# Patient Record
Sex: Female | Born: 1980
Health system: Southern US, Community
[De-identification: ages and names within clinical notes are randomized; demographics above are authoritative.]

## PROBLEM LIST (undated history)

## (undated) DIAGNOSIS — I10 Essential (primary) hypertension: Secondary | ICD-10-CM

## (undated) DIAGNOSIS — IMO0002 Reserved for concepts with insufficient information to code with codable children: Secondary | ICD-10-CM

## (undated) DIAGNOSIS — F418 Other specified anxiety disorders: Secondary | ICD-10-CM

## (undated) DIAGNOSIS — D219 Benign neoplasm of connective and other soft tissue, unspecified: Secondary | ICD-10-CM

## (undated) DIAGNOSIS — D649 Anemia, unspecified: Secondary | ICD-10-CM

## (undated) DIAGNOSIS — D259 Leiomyoma of uterus, unspecified: Secondary | ICD-10-CM

## (undated) DIAGNOSIS — F419 Anxiety disorder, unspecified: Secondary | ICD-10-CM

## (undated) DIAGNOSIS — K219 Gastro-esophageal reflux disease without esophagitis: Secondary | ICD-10-CM

## (undated) DIAGNOSIS — N920 Excessive and frequent menstruation with regular cycle: Secondary | ICD-10-CM

## (undated) DIAGNOSIS — Z973 Presence of spectacles and contact lenses: Secondary | ICD-10-CM

## (undated) DIAGNOSIS — Z8679 Personal history of other diseases of the circulatory system: Secondary | ICD-10-CM

## (undated) HISTORY — DX: Reserved for concepts with insufficient information to code with codable children: IMO0002

## (undated) HISTORY — PX: ANKLE SURGERY: SHX546

---
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
## 2002-06-13 ENCOUNTER — Other Ambulatory Visit: Admission: RE | Admit: 2002-06-13 | Discharge: 2002-06-13 | Payer: Self-pay | Admitting: Family Medicine

## 2012-04-03 DIAGNOSIS — R87613 High grade squamous intraepithelial lesion on cytologic smear of cervix (HGSIL): Secondary | ICD-10-CM

## 2012-04-03 DIAGNOSIS — Z8742 Personal history of other diseases of the female genital tract: Secondary | ICD-10-CM

## 2012-04-03 HISTORY — DX: High grade squamous intraepithelial lesion on cytologic smear of cervix (HGSIL): R87.613

## 2012-04-03 HISTORY — DX: Personal history of other diseases of the female genital tract: Z87.42

## 2012-04-03 HISTORY — PX: CERVICAL BIOPSY  W/ LOOP ELECTRODE EXCISION: SUR135

## 2012-08-21 ENCOUNTER — Other Ambulatory Visit: Payer: Self-pay | Admitting: Family Medicine

## 2012-08-21 ENCOUNTER — Ambulatory Visit (INDEPENDENT_AMBULATORY_CARE_PROVIDER_SITE_OTHER): Payer: Self-pay | Admitting: Family Medicine

## 2012-08-21 ENCOUNTER — Encounter: Payer: Self-pay | Admitting: Family Medicine

## 2012-08-21 ENCOUNTER — Other Ambulatory Visit (HOSPITAL_COMMUNITY)
Admission: RE | Admit: 2012-08-21 | Discharge: 2012-08-21 | Disposition: A | Discharge status: Home / Self Care | Payer: Self-pay | Source: Ambulatory Visit | Attending: Family Medicine | Admitting: Family Medicine

## 2012-08-21 VITALS — BP 141/89 | HR 88 | Temp 98.1°F | Resp 20 | Ht 65.0 in | Wt 125.4 lb

## 2012-08-21 DIAGNOSIS — N871 Moderate cervical dysplasia: Secondary | ICD-10-CM | POA: Insufficient documentation

## 2012-08-21 DIAGNOSIS — R87613 High grade squamous intraepithelial lesion on cytologic smear of cervix (HGSIL): Secondary | ICD-10-CM

## 2012-08-21 DIAGNOSIS — Z01812 Encounter for preprocedural laboratory examination: Secondary | ICD-10-CM

## 2012-08-21 NOTE — Progress Notes (Signed)
UPT:negative

## 2012-08-21 NOTE — Patient Instructions (Addendum)
Colposcopy  Care After  Colposcopy is a procedure in which a special tool is used to magnify the surface of the cervix. A tissue sample (biopsy) may also be taken. This sample will be looked at for cervical cancer or other problems. After the test:  · You may have some cramping.  · Lie down for a few minutes if you feel lightheaded.  ·  You may have some bleeding which should stop in a few days.  HOME CARE  · Do not have sex or use tampons for 2 to 3 days or as told.  · Only take medicine as told by your doctor.  · Continue to take your birth control pills as usual.  Finding out the results of your test  Ask when your test results will be ready. Make sure you get your test results.  GET HELP RIGHT AWAY IF:  · You are bleeding a lot or are passing blood clots.  · You develop a fever of 102° F (38.9° C) or higher.  · You have abnormal vaginal discharge.  · You have cramps that do not go away with medicine.  · You feel lightheaded, dizzy, or pass out (faint).  MAKE SURE YOU:   · Understand these instructions.  · Will watch your condition.  · Will get help right away if you are not doing well or get worse.  Document Released: 09/06/2007 Document Revised: 06/12/2011 Document Reviewed: 09/06/2007  ExitCare® Patient Information ©2014 [employer].

## 2012-08-21 NOTE — Progress Notes (Signed)
Patient ID: Katherine Ingram, female   DOB: 03/11/1981, 32 y.o.   MRN: [id number]   [name] is here in follow up to an abnormal pap smear at [facility] on 06/25/12. Report shows HSIL - encompassing CIS, moderate and severe dysplasia. She states that she had not had any previous abnormal pap smears and has never had a colposcopy.  She has fibroids and sometimes has 2 periods in one month, somewhat heavy. She does not smoke.   Filed Vitals:   08/21/12 1552  BP: 141/89  Pulse: 88  Temp: 98.1 F (36.7 C)  Resp: 20   Exam: Gen: alert, mildly anxious, no distress Abd:  Soft, non-tender, no guarding or rebound GU:  Normal external genitalia, uterus normal size and mobility, anteflexed, no CMT, no vaginal discharge, normal non-parous cervix, no bleeding.  GYNECOLOGY CLINIC PROCEDURE NOTE  32 y.o. G0P0 here for colposcopy for high-grade squamous intraepithelial neoplasia  (HGSIL-encompassing moderate and severe dysplasia) pap smear on 06/25/12. Discussed role for HPV in cervical dysplasia, need for surveillance.  Patient given informed consent, signed copy in the chart, time out was performed.  Placed in lithotomy position. Cervix viewed with speculum and colposcope after application of acetic acid.   Colposcopy adequate? No - transformation zone within external os, not entirely visible.  acetowhite lesion(s) noted at 6 o'clock-8 o'clock, punctation noted at 6 o'clock and mosaicism noted at 5 o'clock; biopsies obtained.  ECC specimen obtained. All specimens were labelled and sent to pathology.  Patient was given post procedure instructions.  Will follow up pathology and manage accordingly.  Routine preventative health maintenance measures emphasized.  Napoleon Form, MD

## 2012-08-23 ENCOUNTER — Encounter: Payer: Self-pay | Admitting: Family Medicine

## 2012-08-23 DIAGNOSIS — R87613 High grade squamous intraepithelial lesion on cytologic smear of cervix (HGSIL): Secondary | ICD-10-CM | POA: Insufficient documentation

## 2012-09-23 ENCOUNTER — Telehealth: Payer: Self-pay | Admitting: *Deleted

## 2012-09-23 NOTE — Telephone Encounter (Signed)
Called pt and left message to return to call to the clinics.  

## 2012-09-23 NOTE — Telephone Encounter (Signed)
Patient left message that she was seen a month ago and hasn't heard her results yet.

## 2012-09-24 NOTE — Telephone Encounter (Signed)
Spoke to pt about her results and the need for a LEEP procedure.  I explained what the LEEP procedure is and that due to our office closing.  If she could please call and schedule the appt.  Pt stated understanding and did not have any other questions.

## 2012-10-28 ENCOUNTER — Encounter: Payer: Self-pay | Admitting: Obstetrics and Gynecology

## 2012-10-28 ENCOUNTER — Other Ambulatory Visit (HOSPITAL_COMMUNITY)
Admission: RE | Admit: 2012-10-28 | Discharge: 2012-10-28 | Disposition: A | Discharge status: Home / Self Care | Payer: Self-pay | Source: Ambulatory Visit | Attending: Obstetrics and Gynecology | Admitting: Obstetrics and Gynecology

## 2012-10-28 ENCOUNTER — Ambulatory Visit (INDEPENDENT_AMBULATORY_CARE_PROVIDER_SITE_OTHER): Payer: Self-pay | Admitting: Obstetrics and Gynecology

## 2012-10-28 VITALS — BP 132/84 | HR 85 | Temp 97.8°F | Ht 71.0 in | Wt 125.5 lb

## 2012-10-28 DIAGNOSIS — G8918 Other acute postprocedural pain: Secondary | ICD-10-CM

## 2012-10-28 DIAGNOSIS — N871 Moderate cervical dysplasia: Secondary | ICD-10-CM | POA: Insufficient documentation

## 2012-10-28 DIAGNOSIS — R87613 High grade squamous intraepithelial lesion on cytologic smear of cervix (HGSIL): Secondary | ICD-10-CM

## 2012-10-28 MED ORDER — IBUPROFEN 200 MG PO TABS
600.0000 mg | ORAL_TABLET | Freq: Once | ORAL | Status: AC
  Administered 2012-10-28: 600 mg via ORAL

## 2012-10-28 NOTE — Progress Notes (Signed)
Patient ID: Guliana Weyandt, female   DOB: Dec 24, 1980, 32 y.o.   MRN: [id number] Patient identified, informed consent obtained, signed copy in chart, time out performed.  Pap smear and colposcopy reviewed.   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  Cervix visualized. Paracervical block placed.  Medium size LOOP used to remove cone of cervix using blend of cut and cautery on LEEP machine.  Edges/Base cauterized with Ball.  Monsel's solution used for hemostasis.  Patient tolerated procedure well.  Patient given post procedure instructions.  Follow up in 6 months for repeat pap or as needed.

## 2012-10-28 NOTE — Addendum Note (Signed)
Addended by: Gerome Apley on: 10/28/2012 03:08 PM   Modules accepted: Orders

## 2012-11-04 ENCOUNTER — Telehealth: Payer: Self-pay | Admitting: Obstetrics and Gynecology

## 2012-11-04 NOTE — Telephone Encounter (Addendum)
Message copied by Toula Moos on Mon Nov 04, 2012  3:52 PM ------called patient and notified of results noted below. Patient agrees and knows to make an appointment in 6 months for repeat pap. Patient satisfied.       Message from: CONSTANT, PEGGY      Created: Mon Nov 04, 2012  2:34 PM       Please inform patient that all abnormal cells were contained within the specimen removed. Follow up as planned in 6 months for repeat pap smear. ------

## 2013-01-22 ENCOUNTER — Encounter: Payer: Self-pay | Admitting: *Deleted

## 2013-02-06 ENCOUNTER — Other Ambulatory Visit: Payer: Self-pay

## 2013-04-11 ENCOUNTER — Ambulatory Visit: Payer: Self-pay | Admitting: Obstetrics & Gynecology

## 2013-04-21 ENCOUNTER — Encounter: Payer: Self-pay | Admitting: Obstetrics & Gynecology

## 2013-04-21 ENCOUNTER — Ambulatory Visit (INDEPENDENT_AMBULATORY_CARE_PROVIDER_SITE_OTHER): Payer: PRIVATE HEALTH INSURANCE | Admitting: Obstetrics & Gynecology

## 2013-04-21 VITALS — BP 121/79 | HR 92 | Temp 97.8°F | Ht 65.0 in | Wt 134.1 lb

## 2013-04-21 DIAGNOSIS — Z1151 Encounter for screening for human papillomavirus (HPV): Secondary | ICD-10-CM

## 2013-04-21 DIAGNOSIS — R87613 High grade squamous intraepithelial lesion on cytologic smear of cervix (HGSIL): Secondary | ICD-10-CM

## 2013-04-21 NOTE — Progress Notes (Signed)
Patient ID: Katherine Ingram, female   DOB: [birth year], 33 y.o.   MRN: [id number] 33 yo G0 s/p LEEP in July 2014 secondary to HGSIL on pap who is here for repeat pap smear. Patient is otherwise doing well  GENERAL: Well-developed, well-nourished female in no acute distress.  PELVIC: Normal external female genitalia. Vagina is pink and rugated.  Normal discharge. Normal appearing cervix. Uterus is normal in size. No adnexal mass or tenderness. EXTREMITIES: No cyanosis, clubbing, or edema, 2+ distal pulses.  A/P 33 yo here for 25-month follow up pap - Pap smear collected - Patient will be contacted with any abnormal results - Plan for repeat pap smear in 6 months pending results

## 2016-04-03 DIAGNOSIS — Z9289 Personal history of other medical treatment: Secondary | ICD-10-CM

## 2016-04-03 HISTORY — DX: Personal history of other medical treatment: Z92.89

## 2016-04-06 ENCOUNTER — Ambulatory Visit (INDEPENDENT_AMBULATORY_CARE_PROVIDER_SITE_OTHER): Payer: BLUE CROSS/BLUE SHIELD | Admitting: Obstetrics and Gynecology

## 2016-04-06 ENCOUNTER — Encounter: Payer: Self-pay | Admitting: Obstetrics and Gynecology

## 2016-04-06 VITALS — BP 135/82 | HR 82 | Wt 132.4 lb

## 2016-04-06 DIAGNOSIS — Z124 Encounter for screening for malignant neoplasm of cervix: Secondary | ICD-10-CM | POA: Diagnosis not present

## 2016-04-06 DIAGNOSIS — Z0000 Encounter for general adult medical examination without abnormal findings: Secondary | ICD-10-CM | POA: Diagnosis not present

## 2016-04-06 DIAGNOSIS — Z1151 Encounter for screening for human papillomavirus (HPV): Secondary | ICD-10-CM | POA: Diagnosis not present

## 2016-04-06 DIAGNOSIS — Z01419 Encounter for gynecological examination (general) (routine) without abnormal findings: Secondary | ICD-10-CM | POA: Diagnosis not present

## 2016-04-06 NOTE — Progress Notes (Signed)
Subjective:     Katherine Ingram is a 36 y.o. female G0 who is here for a comprehensive physical exam. The patient reports no problems. She is sexually active without contraception. She is not interested in contraception at this time. She reports regular monthly period of 4 days associated with some dysmenorrhea which is well controlled with ibuprofen. Patient denies any abnormal discharge  Past Medical History:  Diagnosis Date  . Abnormal Pap smear    Past Surgical History:  Procedure Laterality Date  . ANKLE SURGERY     Lt ankle fracture   Family History  Problem Relation Age of Onset  . Diabetes Father   . Scoliosis Sister     Social History   Social History  . Marital status: Unknown    Spouse name: N/A  . Number of children: N/A  . Years of education: N/A   Occupational History  . Not on file.   Social History Main Topics  . Smoking status: Former Smoker    Types: Cigarettes  . Smokeless tobacco: Not on file  . Alcohol use No  . Drug use: No  . Sexual activity: No   Other Topics Concern  . Not on file   Social History Narrative  . No narrative on file   Health Maintenance  Topic Date Due  . HIV Screening  03/10/1996  . TETANUS/TDAP  03/10/2000  . INFLUENZA VACCINE  11/02/2015  . PAP SMEAR  04/21/2016       Review of Systems Pertinent items are noted in HPI.   Objective:  Blood pressure 135/82, pulse 82, weight 132 lb 6.4 oz (60.1 kg), last menstrual period 04/01/2016.      GENERAL: Well-developed, well-nourished female in no acute distress.  HEENT: Normocephalic, atraumatic. Sclerae anicteric.  NECK: Supple. Normal thyroid.  LUNGS: Clear to auscultation bilaterally.  HEART: Regular rate and rhythm. BREASTS: Symmetric in size. No palpable masses or lymphadenopathy, skin changes, or nipple drainage. ABDOMEN: Soft, nontender, nondistended. Palpable fibroid uterus PELVIC: Normal external female genitalia. Vagina is pink and rugated.  Normal discharge.  Normal appearing cervix. Uterus is 12-weeks size. No adnexal mass or tenderness. EXTREMITIES: No cyanosis, clubbing, or edema, 2+ distal pulses.    Assessment:    Healthy female exam.      Plan:    pap smear collected Patient with palpable fibroid but currently asymptomatic. Precautions reviewed Patient will be notified af abnormal results Advised patient to continue monthly breast exam RTC prn See After Visit Summary for Counseling Recommendations

## 2016-04-12 LAB — CYTOLOGY - PAP
Diagnosis: NEGATIVE
HPV: DETECTED — AB

## 2016-08-04 ENCOUNTER — Ambulatory Visit: Payer: BLUE CROSS/BLUE SHIELD | Admitting: Obstetrics and Gynecology

## 2016-09-27 ENCOUNTER — Encounter: Payer: Self-pay | Admitting: Obstetrics and Gynecology

## 2016-09-27 ENCOUNTER — Ambulatory Visit (INDEPENDENT_AMBULATORY_CARE_PROVIDER_SITE_OTHER): Payer: BLUE CROSS/BLUE SHIELD | Admitting: Obstetrics and Gynecology

## 2016-09-27 VITALS — BP 127/71 | HR 92 | Ht 65.0 in | Wt 132.8 lb

## 2016-09-27 DIAGNOSIS — D259 Leiomyoma of uterus, unspecified: Secondary | ICD-10-CM | POA: Diagnosis not present

## 2016-09-27 NOTE — Progress Notes (Signed)
36 yo G0 here for evaluation on fibroid uterus. Patient reports a monthly period lasting 3-4 days with moderate flow, unchanged from her last visit. She reports some LLQ discomfort around the time of her period. The pain is mild and worst with palpation of the area. She states that she can feel a mass when she pushes on the area. She plans to conceive in the next few years once she is married.   Past Medical History:  Diagnosis Date  . Abnormal Pap smear    Past Surgical History:  Procedure Laterality Date  . ANKLE SURGERY     Lt ankle fracture   Family History  Problem Relation Age of Onset  . Diabetes Father   . Scoliosis Sister    Social History  Substance Use Topics  . Smoking status: Former Smoker    Years: 15.00    Types: Cigarettes  . Smokeless tobacco: Never Used  . Alcohol use No   ROS See pertinent in HPI  Blood pressure 127/71, pulse 92, height 5\' 5"  (1.651 m), weight 132 lb 12.8 oz (60.2 kg), last menstrual period 09/17/2016. GENERAL: Well-developed, well-nourished female in no acute distress.  ABDOMEN: Soft, nontender, nondistended. Palpable fibroid uterus, bulky, mobile approximately 16-week size PELVIC: Patient declined EXTREMITIES: No cyanosis, clubbing, or edema, 2+ distal pulses.  A/P 36 yo with fibroid uterus - Pelvic ultrasound ordered - Discussed surgical intervention with myomectomy pending size of fibroids prior to conception. Patient understands the benefits but is fearful of surgery in general - Patient will be contacted with ultrasound results and will decide on surgical intervention when ready - RTC prn

## 2016-09-27 NOTE — Patient Instructions (Signed)
Myomectomy Myomectomy is surgery to remove a noncancerous tumor (myoma) from the uterus. Myomas are tumors made up of fibrous tissue. They are often called fibroid tumors. Fibroid tumors can range from the size of a pea to the size of a grapefruit. In a myomectomy, the fibroid tumor is removed without removing the uterus. Because these tumors are rarely cancerous, this surgery is usually done only if the tumor is growing or causing symptoms such as pain, pressure, bleeding, or pain with intercourse. LET YOUR HEALTH CARE PROVIDER KNOW ABOUT:  Any allergies you have.  All medicines you are taking, including vitamins, herbs, eye drops, creams, and over-the-counter medicines.  Previous problems you or members of your family have had with the use of anesthetics.  Any blood disorders you have.  Previous surgeries you have had.  Medical conditions you have. RISKS AND COMPLICATIONS Generally, this is a safe procedure. However, as with any procedure, complications can occur. Possible complications include:  Excessive bleeding.  Infection.  Injury to nearby organs.  Blood clots in the legs, chest, or brain.  Scar tissue on other organs and in the pelvis. This may require another surgery to remove the scar tissue.  BEFORE THE PROCEDURE  Ask your health care provider about changing or stopping your regular medicines. Avoid taking aspirin or blood thinners as directed by your health care provider.  Do not  eat or drink anything after midnight on the night before surgery.  If you smoke, do not  smoke for 2 weeks before the surgery.  Do not  drink alcohol the day before the surgery.  Arrange for someone to drive you home after the procedure or after your hospital stay. Also arrange for someone to help you with activities during your recovery. PROCEDURE You will be given medicine to make you sleep through the procedure (general anesthetic). Any of the following methods may be used to perform  a myomectomy:  Small monitors will be put on your body. They are used to check your heart, blood pressure, and oxygen level.  An IV access tube will be put into one of your veins. Medicine will be able to flow directly into your body through this IV tube.  You might be given a medicine to help you relax (sedative).  You will be given a medicine to make you sleep (general anesthetic). A breathing tube will be placed into your lungs during the procedure.  A thin, flexible tube (catheter) will be inserted into your bladder to collect urine.  Any of the following methods may be used to perform a myomectomy: ? Hysteroscopic myomectomy-This method may be used when the fibroid tumor is inside the cavity of the uterus. A long, thin tube that is like a telescope (hysteroscope) is inserted inside the uterus. A saline solution is put into your uterus. This expands the uterus and allows the surgeon to see the fibroids. Tools are passed through the hysteroscope to remove the fibroid tumor in pieces. ? Laparoscopic myomectomy-A few small cuts (incisions) are made in the lower abdomen. A thin, lighted tube with a tiny camera on the end (laparoscope) is inserted through one of the incisions. This gives the surgeon a good view of the area. The fibroid tumor is removed through the other incisions. The incisions are then closed with stitches (sutures) or staples. ? Abdominal myomectomy-This method is used when the fibroid tumor cannot be removed with a hysteroscope or laparoscope. The surgery is performed through a larger surgical incision in the abdomen. The   fibroid tumor is removed through this incision. The incision is closed with sutures or staples.  What to expect after the procedure  If you had a laparoscopic or hysteroscopic myomectomy, you may be able to go home the same day, or you may need to stay in the hospital overnight.  If you had an abdominal myomectomy, you may need to stay in the hospital for a  few days.  Your IV access tube and catheter will be removed in 1-2 days.  You may be given medicine for pain or to help you sleep.  You may be given an antibiotic medicine, if needed. This information is not intended to replace advice given to you by your health care provider. Make sure you discuss any questions you have with your health care provider. Document Released: 01/15/2007 Document Revised: 08/26/2015 Document Reviewed: 10/30/2012 Elsevier Interactive Patient Education  2017 Elsevier Inc.  

## 2016-10-05 ENCOUNTER — Ambulatory Visit (HOSPITAL_COMMUNITY)
Admission: RE | Admit: 2016-10-05 | Discharge: 2016-10-05 | Disposition: A | Discharge status: Home / Self Care | Payer: BLUE CROSS/BLUE SHIELD | Source: Ambulatory Visit | Attending: Obstetrics and Gynecology | Admitting: Obstetrics and Gynecology

## 2016-10-05 DIAGNOSIS — D259 Leiomyoma of uterus, unspecified: Secondary | ICD-10-CM | POA: Insufficient documentation

## 2016-10-06 ENCOUNTER — Telehealth: Payer: Self-pay

## 2016-10-06 NOTE — Telephone Encounter (Signed)
-----   Message from Mora Bellman, MD sent at 10/05/2016  6:45 PM EDT ----- Please inform patient that ultrasound demonstrates 2 normal ovaries but an enlarged fibroid uterus with at least 3 large fibroids.  We can proceed to schedule removal of fibroids (myomectomy) as discussed or we can schedule another appointment to answer any other questions she may have before scheduling the procedure  Thanks  Peggy

## 2016-10-06 NOTE — Telephone Encounter (Signed)
Patient inform of lab results. She would like to come back in to discuss removal of fibroids. Patient inform we will call her back with an appointment next week.

## 2016-11-08 ENCOUNTER — Ambulatory Visit (INDEPENDENT_AMBULATORY_CARE_PROVIDER_SITE_OTHER): Payer: BLUE CROSS/BLUE SHIELD | Admitting: Obstetrics and Gynecology

## 2016-11-08 DIAGNOSIS — N920 Excessive and frequent menstruation with regular cycle: Secondary | ICD-10-CM | POA: Diagnosis not present

## 2016-11-08 DIAGNOSIS — D252 Subserosal leiomyoma of uterus: Secondary | ICD-10-CM | POA: Diagnosis not present

## 2016-11-08 DIAGNOSIS — D251 Intramural leiomyoma of uterus: Secondary | ICD-10-CM

## 2016-11-09 ENCOUNTER — Telehealth: Payer: Self-pay | Admitting: Obstetrics and Gynecology

## 2016-11-09 ENCOUNTER — Other Ambulatory Visit: Payer: Self-pay

## 2016-11-09 DIAGNOSIS — D219 Benign neoplasm of connective and other soft tissue, unspecified: Secondary | ICD-10-CM | POA: Insufficient documentation

## 2016-11-09 DIAGNOSIS — N920 Excessive and frequent menstruation with regular cycle: Secondary | ICD-10-CM | POA: Insufficient documentation

## 2016-11-09 MED ORDER — TRANEXAMIC ACID 650 MG PO TABS: 1300.0000 mg | ORAL_TABLET | Freq: Three times a day (TID) | ORAL | 2 refills | Status: DC

## 2016-11-09 NOTE — Patient Instructions (Signed)
Myomectomy Myomectomy is surgery to remove a noncancerous tumor (myoma) from the uterus. Myomas are tumors made up of fibrous tissue. They are often called fibroid tumors. Fibroid tumors can range from the size of a pea to the size of a grapefruit. In a myomectomy, the fibroid tumor is removed without removing the uterus. Because these tumors are rarely cancerous, this surgery is usually done only if the tumor is growing or causing symptoms such as pain, pressure, bleeding, or pain with intercourse. LET YOUR HEALTH CARE PROVIDER KNOW ABOUT:  Any allergies you have.  All medicines you are taking, including vitamins, herbs, eye drops, creams, and over-the-counter medicines.  Previous problems you or members of your family have had with the use of anesthetics.  Any blood disorders you have.  Previous surgeries you have had.  Medical conditions you have. RISKS AND COMPLICATIONS Generally, this is a safe procedure. However, as with any procedure, complications can occur. Possible complications include:  Excessive bleeding.  Infection.  Injury to nearby organs.  Blood clots in the legs, chest, or brain.  Scar tissue on other organs and in the pelvis. This may require another surgery to remove the scar tissue.  BEFORE THE PROCEDURE  Ask your health care provider about changing or stopping your regular medicines. Avoid taking aspirin or blood thinners as directed by your health care provider.  Do not  eat or drink anything after midnight on the night before surgery.  If you smoke, do not  smoke for 2 weeks before the surgery.  Do not  drink alcohol the day before the surgery.  Arrange for someone to drive you home after the procedure or after your hospital stay. Also arrange for someone to help you with activities during your recovery. PROCEDURE You will be given medicine to make you sleep through the procedure (general anesthetic). Any of the following methods may be used to perform  a myomectomy:  Small monitors will be put on your body. They are used to check your heart, blood pressure, and oxygen level.  An IV access tube will be put into one of your veins. Medicine will be able to flow directly into your body through this IV tube.  You might be given a medicine to help you relax (sedative).  You will be given a medicine to make you sleep (general anesthetic). A breathing tube will be placed into your lungs during the procedure.  A thin, flexible tube (catheter) will be inserted into your bladder to collect urine.  Any of the following methods may be used to perform a myomectomy: ? Hysteroscopic myomectomy-This method may be used when the fibroid tumor is inside the cavity of the uterus. A long, thin tube that is like a telescope (hysteroscope) is inserted inside the uterus. A saline solution is put into your uterus. This expands the uterus and allows the surgeon to see the fibroids. Tools are passed through the hysteroscope to remove the fibroid tumor in pieces. ? Laparoscopic myomectomy-A few small cuts (incisions) are made in the lower abdomen. A thin, lighted tube with a tiny camera on the end (laparoscope) is inserted through one of the incisions. This gives the surgeon a good view of the area. The fibroid tumor is removed through the other incisions. The incisions are then closed with stitches (sutures) or staples. ? Abdominal myomectomy-This method is used when the fibroid tumor cannot be removed with a hysteroscope or laparoscope. The surgery is performed through a larger surgical incision in the abdomen. The   fibroid tumor is removed through this incision. The incision is closed with sutures or staples.  What to expect after the procedure  If you had a laparoscopic or hysteroscopic myomectomy, you may be able to go home the same day, or you may need to stay in the hospital overnight.  If you had an abdominal myomectomy, you may need to stay in the hospital for a  few days.  Your IV access tube and catheter will be removed in 1-2 days.  You may be given medicine for pain or to help you sleep.  You may be given an antibiotic medicine, if needed. This information is not intended to replace advice given to you by your health care provider. Make sure you discuss any questions you have with your health care provider. Document Released: 01/15/2007 Document Revised: 08/26/2015 Document Reviewed: 10/30/2012 Elsevier Interactive Patient Education  2017 Elsevier Inc.  

## 2016-11-09 NOTE — Progress Notes (Signed)
Pt here to discuss surgery. Normally sees Dr Elly Modena. Dx with uterine fibroids. Desires pregnancy in the future. Cycles regular but heavy and painful.  Myomectomy has been discussed with pt.   Tx options reviewed with pt.  Myomectomy reviewed with pt. R/B/Post op care discussed. Pt would like to proceed. Will start Lysteda in the meantime to help with cycles. U/R/B reviewed.  Pt will be scheduled for abd myomectomy with Dr. Elly Modena.  F/U with post op appt

## 2016-11-09 NOTE — Telephone Encounter (Signed)
Patient medication was sent to the wrong pharmacy will resend to [facility] in [place]

## 2016-11-09 NOTE — Telephone Encounter (Signed)
Patient state her RX was sent to the wrong pharmacy need to be sent to [facility] in [place]   Patient also want to know when her surgery going to be

## 2016-11-10 ENCOUNTER — Encounter (HOSPITAL_COMMUNITY): Payer: Self-pay

## 2016-11-14 ENCOUNTER — Other Ambulatory Visit: Payer: Self-pay | Admitting: Obstetrics and Gynecology

## 2016-12-27 NOTE — Patient Instructions (Signed)
Your procedure is scheduled on:  Tuesday, Oct. 9, 2018  Enter through the [employer] [facility] at:  11:00 AM  Pick up the phone at the desk and dial [phone].  Call this number if you have problems the morning of surgery: [phone].  Remember: Do NOT eat food:  After Midnight Monday  Do NOT drink clear liquids after:  6:30 AM Tuesday  Take these medicines the morning of surgery with a SIP OF WATER:  None  Stop ALL herbal medications at this time  Do NOT smoke the day of surgery.  Do NOT wear jewelry (body piercing), metal hair clips/bobby pins, make-up, artifical eyelashes or nail polish. Do NOT wear lotions, powders, or perfumes.  You may wear deodorant. Do NOT shave for 48 hours prior to surgery. Do NOT bring valuables to the hospital. Contacts, dentures, or bridgework may not be worn into surgery.  Leave suitcase in car.  After surgery it may be brought to your room.  For patients admitted to the hospital, checkout time is 11:00 AM the day of discharge.  Bring a copy of your healthcare power of attorney and living will documents.

## 2016-12-28 ENCOUNTER — Encounter (HOSPITAL_COMMUNITY): Payer: Self-pay

## 2016-12-28 ENCOUNTER — Encounter (HOSPITAL_COMMUNITY)
Admission: RE | Admit: 2016-12-28 | Discharge: 2016-12-28 | Disposition: A | Discharge status: Home / Self Care | Payer: BLUE CROSS/BLUE SHIELD | Source: Ambulatory Visit | Attending: Obstetrics and Gynecology | Admitting: Obstetrics and Gynecology

## 2016-12-28 DIAGNOSIS — D509 Iron deficiency anemia, unspecified: Secondary | ICD-10-CM | POA: Diagnosis not present

## 2016-12-28 DIAGNOSIS — D259 Leiomyoma of uterus, unspecified: Secondary | ICD-10-CM | POA: Diagnosis not present

## 2016-12-28 DIAGNOSIS — N939 Abnormal uterine and vaginal bleeding, unspecified: Secondary | ICD-10-CM | POA: Diagnosis present

## 2016-12-28 DIAGNOSIS — R531 Weakness: Secondary | ICD-10-CM | POA: Diagnosis not present

## 2016-12-28 DIAGNOSIS — Z79899 Other long term (current) drug therapy: Secondary | ICD-10-CM | POA: Diagnosis not present

## 2016-12-28 HISTORY — DX: Gastro-esophageal reflux disease without esophagitis: K21.9

## 2016-12-28 HISTORY — DX: Anemia, unspecified: D64.9

## 2016-12-28 LAB — ABO/RH: ABO/RH(D): O POS

## 2016-12-28 LAB — CBC
HCT: 20.3 % — ABNORMAL LOW (ref 36.0–46.0)
HEMOGLOBIN: 5.1 g/dL — AB (ref 12.0–15.0)
MCH: 15.1 pg — AB (ref 26.0–34.0)
MCHC: 25.1 g/dL — ABNORMAL LOW (ref 30.0–36.0)
MCV: 60.2 fL — AB (ref 78.0–100.0)
Platelets: 338 10*3/uL (ref 150–400)
RBC: 3.37 MIL/uL — AB (ref 3.87–5.11)
RDW: 20.6 % — ABNORMAL HIGH (ref 11.5–15.5)
WBC: 8.7 10*3/uL (ref 4.0–10.5)

## 2016-12-28 NOTE — Pre-Procedure Instructions (Signed)
Dr. Maylene RoesTamala Julian notified of hgb of 5.5 and Hct 20.3

## 2016-12-29 ENCOUNTER — Telehealth: Payer: Self-pay | Admitting: Obstetrics & Gynecology

## 2016-12-29 ENCOUNTER — Observation Stay (HOSPITAL_COMMUNITY)
Admission: AD | Admit: 2016-12-29 | Discharge: 2016-12-30 | Disposition: A | Discharge status: Home / Self Care | Payer: BLUE CROSS/BLUE SHIELD | Source: Ambulatory Visit | Attending: Obstetrics & Gynecology | Admitting: Obstetrics & Gynecology

## 2016-12-29 ENCOUNTER — Encounter (HOSPITAL_COMMUNITY): Payer: Self-pay

## 2016-12-29 DIAGNOSIS — D500 Iron deficiency anemia secondary to blood loss (chronic): Secondary | ICD-10-CM | POA: Diagnosis not present

## 2016-12-29 DIAGNOSIS — N939 Abnormal uterine and vaginal bleeding, unspecified: Principal | ICD-10-CM | POA: Insufficient documentation

## 2016-12-29 DIAGNOSIS — N938 Other specified abnormal uterine and vaginal bleeding: Secondary | ICD-10-CM

## 2016-12-29 DIAGNOSIS — R531 Weakness: Secondary | ICD-10-CM | POA: Insufficient documentation

## 2016-12-29 DIAGNOSIS — D649 Anemia, unspecified: Secondary | ICD-10-CM

## 2016-12-29 DIAGNOSIS — D259 Leiomyoma of uterus, unspecified: Secondary | ICD-10-CM | POA: Insufficient documentation

## 2016-12-29 DIAGNOSIS — Z79899 Other long term (current) drug therapy: Secondary | ICD-10-CM | POA: Insufficient documentation

## 2016-12-29 DIAGNOSIS — D509 Iron deficiency anemia, unspecified: Secondary | ICD-10-CM | POA: Insufficient documentation

## 2016-12-29 HISTORY — DX: Anemia, unspecified: D64.9

## 2016-12-29 LAB — URINALYSIS, ROUTINE W REFLEX MICROSCOPIC
Bilirubin Urine: NEGATIVE
Glucose, UA: NEGATIVE mg/dL
Ketones, ur: NEGATIVE mg/dL
Leukocytes, UA: NEGATIVE
Nitrite: NEGATIVE
PH: 6 (ref 5.0–8.0)
PROTEIN: NEGATIVE mg/dL
Specific Gravity, Urine: 1.001 — ABNORMAL LOW (ref 1.005–1.030)
Squamous Epithelial / LPF: NONE SEEN

## 2016-12-29 LAB — CBC
HEMATOCRIT: 20.2 % — AB (ref 36.0–46.0)
Hemoglobin: 5.2 g/dL — CL (ref 12.0–15.0)
MCH: 15.4 pg — AB (ref 26.0–34.0)
MCHC: 25.7 g/dL — AB (ref 30.0–36.0)
MCV: 59.9 fL — AB (ref 78.0–100.0)
Platelets: 332 10*3/uL (ref 150–400)
RBC: 3.37 MIL/uL — ABNORMAL LOW (ref 3.87–5.11)
RDW: 20.7 % — AB (ref 11.5–15.5)
WBC: 6.7 10*3/uL (ref 4.0–10.5)

## 2016-12-29 LAB — TYPE AND SCREEN
ABO/RH(D): O POS
Antibody Screen: NEGATIVE

## 2016-12-29 LAB — PREPARE RBC (CROSSMATCH)

## 2016-12-29 MED ORDER — SODIUM CHLORIDE 0.9 % IV SOLN
INTRAVENOUS | Status: DC
  Administered 2016-12-29: 15:00:00 via INTRAVENOUS

## 2016-12-29 MED ORDER — ACETAMINOPHEN 325 MG PO TABS
650.0000 mg | ORAL_TABLET | Freq: Once | ORAL | Status: AC
Start: 2016-12-29 — End: 2016-12-29
  Administered 2016-12-29: 650 mg via ORAL
  Filled 2016-12-29: qty 2

## 2016-12-29 MED ORDER — DIPHENHYDRAMINE HCL 25 MG PO CAPS
25.0000 mg | ORAL_CAPSULE | Freq: Once | ORAL | Status: AC
  Administered 2016-12-29: 25 mg via ORAL
  Filled 2016-12-29: qty 1

## 2016-12-29 NOTE — Telephone Encounter (Signed)
TC to pt to notify her of her low Hgb. Pt is scheduled for a abd myomectomy Oct 9th with Dr. Elly Modena. Pt asked to come to the MAU for eval. She denies dizziness or SOB.   CBC    Component Value Date/Time   WBC 8.7 12/28/2016 1622   RBC 3.37 (L) 12/28/2016 1622   HGB 5.1 (LL) 12/28/2016 1622   HCT 20.3 (L) 12/28/2016 1622   PLT 338 12/28/2016 1622   MCV 60.2 (L) 12/28/2016 1622   MCH 15.1 (L) 12/28/2016 1622   MCHC 25.1 (L) 12/28/2016 1622   RDW 20.6 (H) 12/28/2016 1622   She will come to the MAU now.    Camarie Mctigue L. Harraway-Smith, M.D., Cherlynn June

## 2016-12-29 NOTE — MAU Note (Signed)
Patient is scheduled for surgery on Oct 9th and did blood work yesterday. Patient received a phone call this morning that her hemoglobin was low and she should come to the MAU to be seen. Patient denies and SOB or dizziness.

## 2016-12-29 NOTE — H&P (Signed)
[] Hover for attribution information  History   CSN: [id number]  Arrival date and time: 12/29/16 1103  First Provider Initiated Contact with Patient 12/29/16 1200         Chief Complaint  Patient presents with  . Anemia  . Fibroids  . Vaginal Bleeding   HPI Katherine Ingram is a 36 y.o. female who presents for anemia. Patient had CBC drawn yesterday showing hemoglobin of 5.1. Was called by office today to present to MAU for possible blood transfusion. Patient with longstanding history of AUB & uterine fibroids. Is scheduled for myomectomy next month. Current menses began on Monday. States she is not saturating pads, does not use tampons, & has not had to change pads in the middle of the night. Has currently used 2 pads today that were not full. Denies headaches, dizziness, CP, SOB, or palpitations. Endorses feelings of weakness. Reports history of anemia that she has taken iron supplements for in the past. Denies ever having had a blood transfusion.       Past Medical History:  Diagnosis Date  . Abnormal Pap smear   . Anemia   . GERD (gastroesophageal reflux disease)     NO MEDS, CAN'T SWALLOW BIG PILLS         Past Surgical History:  Procedure Laterality Date  . ANKLE SURGERY     Lt ankle fracture         Family History  Problem Relation Age of Onset  . Diabetes Father   . Scoliosis Sister         Social History  Substance Use Topics  . Smoking status: Never Smoker  . Smokeless tobacco: Never Used  . Alcohol use No    Allergies: No Known Allergies         Prescriptions Prior to Admission  Medication Sig Dispense Refill Last Dose  . cholecalciferol (VITAMIN D) 1000 units tablet Take 1,000 Units by mouth daily.   12/29/2016 at Unknown time  . tranexamic acid (LYSTEDA) 650 MG TABS tablet Take 2 tablets (1,300 mg total) by mouth 3 (three) times daily. Take during menses for a maximum of five days 30 tablet 2 Past Week at Unknown time     Review of Systems  Respiratory: Negative for shortness of breath.   Cardiovascular: Negative for chest pain, palpitations and leg swelling.  Gastrointestinal: Negative.   Genitourinary: Positive for vaginal bleeding.  Neurological: Positive for weakness. Negative for dizziness and headaches.   Physical Exam   Blood pressure (!) 143/80, pulse (!) 102, temperature 99.3 F (37.4 C), temperature source Oral, SpO2 100 %.  Physical Exam  Nursing note and vitals reviewed. Constitutional: She is oriented to person, place, and time. She appears well-developed and well-nourished. No distress.  HENT:  Head: Normocephalic and atraumatic.  Eyes: Conjunctivae are normal. Right eye exhibits no discharge. Left eye exhibits no discharge. No scleral icterus.  Neck: Normal range of motion.  Cardiovascular: Normal rate, regular rhythm and normal heart sounds.   No murmur heard. Respiratory: Effort normal and breath sounds normal. No respiratory distress. She has no wheezes.  GI: Soft. She exhibits distension (uterine fundus at umbilicus) and mass. There is no tenderness. There is no rebound and no guarding.  Neurological: She is alert and oriented to person, place, and time.  Skin: Skin is warm and dry. She is not diaphoretic.  Psychiatric: She has a normal mood and affect. Her behavior is normal. Judgment and thought content normal.    MAU Course  Procedures  Lab Results Last 24 Hours       Results for orders placed or performed during the hospital encounter of 12/29/16 (from the past 24 hour(s))  Urinalysis, Routine w reflex microscopic     Status: Abnormal   Collection Time: 12/29/16 11:15 AM  Result Value Ref Range   Color, Urine COLORLESS (A) YELLOW   APPearance CLEAR CLEAR   Specific Gravity, Urine 1.001 (L) 1.005 - 1.030   pH 6.0 5.0 - 8.0   Glucose, UA NEGATIVE NEGATIVE mg/dL   Hgb urine dipstick LARGE (A) NEGATIVE   Bilirubin Urine NEGATIVE NEGATIVE   Ketones, ur  NEGATIVE NEGATIVE mg/dL   Protein, ur NEGATIVE NEGATIVE mg/dL   Nitrite NEGATIVE NEGATIVE   Leukocytes, UA NEGATIVE NEGATIVE   RBC / HPF 0-5 0 - 5 RBC/hpf   WBC, UA 0-5 0 - 5 WBC/hpf   Bacteria, UA RARE (A) NONE SEEN   Squamous Epithelial / LPF NONE SEEN NONE SEEN  CBC     Status: Abnormal   Collection Time: 12/29/16 12:19 PM  Result Value Ref Range   WBC 6.7 4.0 - 10.5 K/uL   RBC 3.37 (L) 3.87 - 5.11 MIL/uL   Hemoglobin 5.2 (LL) 12.0 - 15.0 g/dL   HCT 20.2 (L) 36.0 - 46.0 %   MCV 59.9 (L) 78.0 - 100.0 fL   MCH 15.4 (L) 26.0 - 34.0 pg   MCHC 25.7 (L) 30.0 - 36.0 g/dL   RDW 20.7 (H) 11.5 - 15.5 %   Platelets 332 150 - 400 K/uL  Type and screen     Status: None (Preliminary result)   Collection Time: 12/29/16 12:22 PM  Result Value Ref Range   ABO/RH(D) O POS    Antibody Screen PENDING    Sample Expiration 01/01/2017       MDM CBC today shows hgb 5.2 & hct 20.2 C/w Dr. Roselie Awkward. Will place in observation bed & transfuse 3 units of PRBC R/B of blood transfusion discussed. Consent signed.  Assessment and Plan  A; 1. Iron deficiency anemia due to chronic blood loss    P: Place in observation bed Transfuse 3 units PRBC  Jorje Guild 12/29/2016, 11:59 AM     Electronically signed by Jorje Guild, NP at 12/29/2016 1:46 PM Electronically signed by Woodroe Mode, MD at 12/29/2016 9:14 PM

## 2016-12-29 NOTE — MAU Provider Note (Signed)
History     CSN: [id number]  Arrival date and time: 12/29/16 1103  First Provider Initiated Contact with Patient 12/29/16 1200      Chief Complaint  Patient presents with  . Anemia  . Fibroids  . Vaginal Bleeding   HPI Katherine Ingram is a 36 y.o. female who presents for anemia. Patient had CBC drawn yesterday showing hemoglobin of 5.1. Was called by office today to present to MAU for possible blood transfusion. Patient with longstanding history of AUB & uterine fibroids. Is scheduled for myomectomy next month. Current menses began on Monday. States she is not saturating pads, does not use tampons, & has not had to change pads in the middle of the night. Has currently used 2 pads today that were not full. Denies headaches, dizziness, CP, SOB, or palpitations. Endorses feelings of weakness. Reports history of anemia that she has taken iron supplements for in the past. Denies ever having had a blood transfusion.   Past Medical History:  Diagnosis Date  . Abnormal Pap smear   . Anemia   . GERD (gastroesophageal reflux disease)     NO MEDS, CAN'T SWALLOW BIG PILLS    Past Surgical History:  Procedure Laterality Date  . ANKLE SURGERY     Lt ankle fracture    Family History  Problem Relation Age of Onset  . Diabetes Father   . Scoliosis Sister     Social History  Substance Use Topics  . Smoking status: Never Smoker  . Smokeless tobacco: Never Used  . Alcohol use No    Allergies: No Known Allergies  Prescriptions Prior to Admission  Medication Sig Dispense Refill Last Dose  . cholecalciferol (VITAMIN D) 1000 units tablet Take 1,000 Units by mouth daily.   12/29/2016 at Unknown time  . tranexamic acid (LYSTEDA) 650 MG TABS tablet Take 2 tablets (1,300 mg total) by mouth 3 (three) times daily. Take during menses for a maximum of five days 30 tablet 2 Past Week at Unknown time    Review of Systems  Respiratory: Negative for shortness of breath.   Cardiovascular: Negative for  chest pain, palpitations and leg swelling.  Gastrointestinal: Negative.   Genitourinary: Positive for vaginal bleeding.  Neurological: Positive for weakness. Negative for dizziness and headaches.   Physical Exam   Blood pressure (!) 143/80, pulse (!) 102, temperature 99.3 F (37.4 C), temperature source Oral, SpO2 100 %.  Physical Exam  Nursing note and vitals reviewed. Constitutional: She is oriented to person, place, and time. She appears well-developed and well-nourished. No distress.  HENT:  Head: Normocephalic and atraumatic.  Eyes: Conjunctivae are normal. Right eye exhibits no discharge. Left eye exhibits no discharge. No scleral icterus.  Neck: Normal range of motion.  Cardiovascular: Normal rate, regular rhythm and normal heart sounds.   No murmur heard. Respiratory: Effort normal and breath sounds normal. No respiratory distress. She has no wheezes.  GI: Soft. She exhibits distension (uterine fundus at umbilicus) and mass. There is no tenderness. There is no rebound and no guarding.  Neurological: She is alert and oriented to person, place, and time.  Skin: Skin is warm and dry. She is not diaphoretic.  Psychiatric: She has a normal mood and affect. Her behavior is normal. Judgment and thought content normal.    MAU Course  Procedures Results for orders placed or performed during the hospital encounter of 12/29/16 (from the past 24 hour(s))  Urinalysis, Routine w reflex microscopic     Status: Abnormal  Collection Time: 12/29/16 11:15 AM  Result Value Ref Range   Color, Urine COLORLESS (A) YELLOW   APPearance CLEAR CLEAR   Specific Gravity, Urine 1.001 (L) 1.005 - 1.030   pH 6.0 5.0 - 8.0   Glucose, UA NEGATIVE NEGATIVE mg/dL   Hgb urine dipstick LARGE (A) NEGATIVE   Bilirubin Urine NEGATIVE NEGATIVE   Ketones, ur NEGATIVE NEGATIVE mg/dL   Protein, ur NEGATIVE NEGATIVE mg/dL   Nitrite NEGATIVE NEGATIVE   Leukocytes, UA NEGATIVE NEGATIVE   RBC / HPF 0-5 0 - 5  RBC/hpf   WBC, UA 0-5 0 - 5 WBC/hpf   Bacteria, UA RARE (A) NONE SEEN   Squamous Epithelial / LPF NONE SEEN NONE SEEN  CBC     Status: Abnormal   Collection Time: 12/29/16 12:19 PM  Result Value Ref Range   WBC 6.7 4.0 - 10.5 K/uL   RBC 3.37 (L) 3.87 - 5.11 MIL/uL   Hemoglobin 5.2 (LL) 12.0 - 15.0 g/dL   HCT 20.2 (L) 36.0 - 46.0 %   MCV 59.9 (L) 78.0 - 100.0 fL   MCH 15.4 (L) 26.0 - 34.0 pg   MCHC 25.7 (L) 30.0 - 36.0 g/dL   RDW 20.7 (H) 11.5 - 15.5 %   Platelets 332 150 - 400 K/uL  Type and screen     Status: None (Preliminary result)   Collection Time: 12/29/16 12:22 PM  Result Value Ref Range   ABO/RH(D) O POS    Antibody Screen PENDING    Sample Expiration 01/01/2017     MDM CBC today shows hgb 5.2 & hct 20.2 C/w Dr. Roselie Awkward. Will place in observation bed & transfuse 3 units of PRBC R/B of blood transfusion discussed. Consent signed.  Assessment and Plan  A; 1. Iron deficiency anemia due to chronic blood loss    P: Place in observation bed Transfuse 3 units PRBC  Jorje Guild 12/29/2016, 11:59 AM

## 2016-12-30 DIAGNOSIS — D500 Iron deficiency anemia secondary to blood loss (chronic): Secondary | ICD-10-CM | POA: Diagnosis not present

## 2016-12-30 DIAGNOSIS — N938 Other specified abnormal uterine and vaginal bleeding: Secondary | ICD-10-CM | POA: Diagnosis not present

## 2016-12-30 DIAGNOSIS — N939 Abnormal uterine and vaginal bleeding, unspecified: Secondary | ICD-10-CM | POA: Diagnosis not present

## 2016-12-30 LAB — TYPE AND SCREEN
ABO/RH(D): O POS
Antibody Screen: NEGATIVE
UNIT DIVISION: 0
Unit division: 0
Unit division: 0

## 2016-12-30 LAB — BPAM RBC
BLOOD PRODUCT EXPIRATION DATE: 201810062359
BLOOD PRODUCT EXPIRATION DATE: 201810072359
Blood Product Expiration Date: 201810092359
ISSUE DATE / TIME: 201809282030
ISSUE DATE / TIME: 201809281458
ISSUE DATE / TIME: 201809281728
UNIT TYPE AND RH: 9500
UNIT TYPE AND RH: 9500
UNIT TYPE AND RH: 9500

## 2016-12-30 LAB — CBC
HCT: 30.8 % — ABNORMAL LOW (ref 36.0–46.0)
Hemoglobin: 9.5 g/dL — ABNORMAL LOW (ref 12.0–15.0)
MCH: 21.2 pg — AB (ref 26.0–34.0)
MCHC: 30.8 g/dL (ref 30.0–36.0)
MCV: 68.6 fL — AB (ref 78.0–100.0)
PLATELETS: 338 10*3/uL (ref 150–400)
RBC: 4.49 MIL/uL (ref 3.87–5.11)
RDW: 26.5 % — ABNORMAL HIGH (ref 11.5–15.5)
WBC: 12.6 10*3/uL — ABNORMAL HIGH (ref 4.0–10.5)

## 2016-12-30 MED ORDER — MEGESTROL ACETATE 40 MG PO TABS: 40.0000 mg | ORAL_TABLET | Freq: Two times a day (BID) | ORAL | 3 refills | Status: DC

## 2016-12-30 NOTE — Discharge Instructions (Signed)
Uterine Fibroids Uterine fibroids are tissue masses (tumors). They are also called leiomyomas. They can develop inside of a woman's womb (uterus). They can grow very large. Fibroids are not cancerous (benign). Most fibroids do not require medical treatment. Follow these instructions at home:  Keep all follow-up visits as told by your doctor. This is important.  Take medicines only as told by your doctor. ? If you were prescribed a hormone treatment, take the hormone medicines exactly as told. ? Do not take aspirin. It can cause bleeding.  Ask your doctor about taking iron pills and increasing the amount of dark green, leafy vegetables in your diet. These actions can help to boost your blood iron levels.  Pay close attention to your period. Tell your doctor about any changes, such as: ? Increased blood flow. This may require you to use more pads or tampons than usual per month. ? A change in the number of days that your period lasts per month. ? A change in symptoms that come with your period, such as back pain or cramping in your belly area (abdomen). Contact a doctor if:  You have pain in your back or the area between your hip bones (pelvic area) that is not controlled by medicines.  You have pain in your abdomen that is not controlled with medicines.  You have an increase in bleeding between and during periods.  You soak tampons or pads in a half hour or less.  You feel lightheaded.  You feel extra tired.  You feel weak. Get help right away if:  You pass out (faint).  You have a sudden increase in pelvic pain. This information is not intended to replace advice given to you by your health care provider. Make sure you discuss any questions you have with your health care provider. Document Released: 04/22/2010 Document Revised: 11/19/2015 Document Reviewed: 09/16/2013 Elsevier Interactive Patient Education  2018 [employer]  

## 2016-12-30 NOTE — Discharge Summary (Signed)
Physician Discharge Summary  Patient ID: Katherine Ingram MRN: [id number] DOB/AGE: 1980/07/30 36 y.o.  Admit date: 12/29/2016 Discharge date: 12/30/2016  Admission Diagnoses:  Discharge Diagnoses:  Active Problems:   Anemia   Discharged Condition: good  Hospital Course: HPI Katherine Bowdenis a 36 y.o.female who presents for anemia. Patient had CBC drawn yesterday showing hemoglobin of 5.1. Was called by office today to present to MAU for possible blood transfusion. Patient with longstanding history of AUB &uterine fibroids. Is scheduled for myomectomy next month. Current menses began on Monday. States she is not saturating pads, does not use tampons, &has not had to change pads in the middle of the night. Has currently used 2 pads today that were not full. Denies headaches, dizziness, CP, SOB, or palpitations. Endorses feelings of weakness. Reports history of anemia that she has taken iron supplements for in the past. Denies ever having had a blood transfusion.  She was admitted for transfusion with Hb of 5.1 Consults: None  Significant Diagnostic Studies: labs:  CBC    Component Value Date/Time   WBC 12.6 (H) 12/30/2016 0559   RBC 4.49 12/30/2016 0559   HGB 9.5 (L) 12/30/2016 0559   HCT 30.8 (L) 12/30/2016 0559   PLT 338 12/30/2016 0559   MCV 68.6 (L) 12/30/2016 0559   MCH 21.2 (L) 12/30/2016 0559   MCHC 30.8 12/30/2016 0559   RDW 26.5 (H) 12/30/2016 0559     Treatments: IV hydration and transfusion  Discharge Exam: Blood pressure 103/63, pulse 78, temperature 98.1 F (36.7 C), temperature source Oral, resp. rate 17, height 5\' 5"  (1.651 m), weight 59.9 kg (132 lb), SpO2 100 %. General appearance: alert, cooperative and no distress  Disposition: Final discharge disposition not confirmed   Allergies as of 12/30/2016   No Known Allergies     Medication List    TAKE these medications   cholecalciferol 1000 units tablet Commonly known as:  VITAMIN D Take 1,000 Units by  mouth daily.   megestrol 40 MG tablet Commonly known as:  MEGACE Take 1 tablet (40 mg total) by mouth 2 (two) times daily.   tranexamic acid 650 MG Tabs tablet Commonly known as:  LYSTEDA Take 2 tablets (1,300 mg total) by mouth 3 (three) times daily. Take during menses for a maximum of five days            Discharge Care Instructions        Start     Ordered   12/30/16 0000  megestrol (MEGACE) 40 MG tablet  2 times daily     12/30/16 0650     Follow-up Information    Ingram, Peggy, MD Follow up in 1 month(s).   Specialty:  Obstetrics and Gynecology Contact information: [place] [address] [phone]           Signed: Emeterio Ingram 12/30/2016, 6:51 AM

## 2017-01-08 NOTE — H&P (Signed)
Katherine Ingram is an 36 y.o. female G50 with symptomatic fibroid uterus here for scheduled myomectomy. Patient reports worsening pelvic pressure over the years. She describes a monthly cycle of moderate flow. She denies chest pain, SOB, lightheadedness/dizziness. Prior to last month, she never received a blood transfusion secondary anemia. Patient is without any complaints  Pertinent Gynecological History: Menses: regular every month without intermenstrual spotting Contraception: none DES exposure: denies Blood transfusions: yes, 9/28 Sexually transmitted diseases: no past history Previous GYN Procedures: none  Last mammogram: n/a Last pap: normal Date: 04/2016 OB History: G0, P0   Menstrual History: No LMP recorded.    Past Medical History:  Diagnosis Date  . Abnormal Pap smear   . Anemia   . GERD (gastroesophageal reflux disease)     NO MEDS, CAN'T SWALLOW BIG PILLS    Past Surgical History:  Procedure Laterality Date  . ANKLE SURGERY     Lt ankle fracture    Family History  Problem Relation Age of Onset  . Diabetes Father   . Scoliosis Sister     Social History:  reports that she has never smoked. She has never used smokeless tobacco. She reports that she does not drink alcohol or use drugs.  Allergies: No Known Allergies  Prescriptions Prior to Admission  Medication Sig Dispense Refill Last Dose  . cholecalciferol (VITAMIN D) 1000 units tablet Take 1,000 Units by mouth daily.   01/08/2017 at Unknown time  . megestrol (MEGACE) 40 MG tablet Take 1 tablet (40 mg total) by mouth 2 (two) times daily. 30 tablet 3 01/08/2017 at Unknown time  . tranexamic acid (LYSTEDA) 650 MG TABS tablet Take 2 tablets (1,300 mg total) by mouth 3 (three) times daily. Take during menses for a maximum of five days 30 tablet 2 More than a month at Unknown time    ROS See pertinent in HPI Blood pressure (!) 130/99, pulse 87, temperature 98 F (36.7 C), temperature source Oral, resp. rate 16,  SpO2 100 %. Physical Exam GENERAL: Well-developed, well-nourished female in no acute distress.  LUNGS: Clear to auscultation bilaterally.  HEART: Regular rate and rhythm. ABDOMEN: Soft, nontender, nondistended. Palpable mobile fibroid uterus PELVIC: deferred to OR EXTREMITIES: No cyanosis, clubbing, or edema, 2+ distal pulses.  Results for orders placed or performed during the hospital encounter of 01/09/17 (from the past 24 hour(s))  Pregnancy, urine     Status: None   Collection Time: 01/09/17 11:00 AM  Result Value Ref Range   Preg Test, Ur NEGATIVE NEGATIVE    No results found. 10/2016 ultrasound FINDINGS: Uterus  Measurements: 15.6 x 8.6 x 13.5 cm. Numerous uterine fibroids, including:  --7.7 x 5.7 x 7.3 cm subserosal right fundal fibroid  -- 7.0 x 4.4 x 5.4 cm subserosal posterior uterine body fibroid  --2.6 x 2.2 x 2.9 cm submucosal fibroid with mucosal component in the anterior uterine body  Endometrium  Thickness: 19 mm.  No focal abnormality visualized.  Right ovary  Measurements: 2.6 x 2.0 x 1.4 cm. Poorly visualized.  Left ovary  Measurements: 2.9 x 1.6 x 1.7 cm. Poorly visualized.  Other findings  Trace pelvic fluid.  IMPRESSION: Numerous uterine fibroids, including a dominant 7.7 cm subserosal fibroid in the right uterine fundus and dominant 2.9 cm submucosal fibroid with mucosal component in the anterior uterine body.  Endometrial complex measures 19 mm, at the upper limits of normal.  Bilateral ovaries are poorly visualized but grossly unremarkable.   Electronically Signed   By: Bertis Ruddy  [place] Pink M.D.   On: 10/05/2016 16:53  Assessment/Plan: 36 yo G0 with symptomatic fibroid uterus her for myomectomy - Risks, benefits and alternatives were explained including but not limited to risks of bleeding, infection and damage to adjacent organs.  - patient verbalized understanding and all questions were answered - Consent signed  for abdominal myomectomy  Leevon Upperman 01/09/2017, 11:45 AM

## 2017-01-09 ENCOUNTER — Inpatient Hospital Stay (HOSPITAL_COMMUNITY)
Admission: AD | Admit: 2017-01-09 | Discharge: 2017-01-11 | DRG: 743 | Disposition: A | Discharge status: Home / Self Care | Payer: BLUE CROSS/BLUE SHIELD | Source: Ambulatory Visit | Attending: Obstetrics and Gynecology | Admitting: Obstetrics and Gynecology

## 2017-01-09 ENCOUNTER — Encounter (HOSPITAL_COMMUNITY): Payer: Self-pay | Admitting: Anesthesiology

## 2017-01-09 ENCOUNTER — Inpatient Hospital Stay (HOSPITAL_COMMUNITY): Payer: BLUE CROSS/BLUE SHIELD | Admitting: Anesthesiology

## 2017-01-09 ENCOUNTER — Encounter (HOSPITAL_COMMUNITY)
Admission: AD | Disposition: A | Discharge status: Home / Self Care | Payer: Self-pay | Source: Ambulatory Visit | Attending: Obstetrics and Gynecology

## 2017-01-09 DIAGNOSIS — D251 Intramural leiomyoma of uterus: Secondary | ICD-10-CM

## 2017-01-09 DIAGNOSIS — D259 Leiomyoma of uterus, unspecified: Secondary | ICD-10-CM | POA: Diagnosis present

## 2017-01-09 DIAGNOSIS — D649 Anemia, unspecified: Secondary | ICD-10-CM | POA: Diagnosis present

## 2017-01-09 DIAGNOSIS — D252 Subserosal leiomyoma of uterus: Secondary | ICD-10-CM | POA: Diagnosis present

## 2017-01-09 DIAGNOSIS — D250 Submucous leiomyoma of uterus: Secondary | ICD-10-CM | POA: Diagnosis present

## 2017-01-09 DIAGNOSIS — Z9889 Other specified postprocedural states: Secondary | ICD-10-CM

## 2017-01-09 HISTORY — PX: MYOMECTOMY: SHX85

## 2017-01-09 LAB — CBC
HCT: 42.7 % (ref 36.0–46.0)
Hemoglobin: 12.7 g/dL (ref 12.0–15.0)
MCH: 21.7 pg — ABNORMAL LOW (ref 26.0–34.0)
MCHC: 29.7 g/dL — AB (ref 30.0–36.0)
MCV: 73 fL — ABNORMAL LOW (ref 78.0–100.0)
PLATELETS: 358 10*3/uL (ref 150–400)
RBC: 5.85 MIL/uL — ABNORMAL HIGH (ref 3.87–5.11)
WBC: 7.7 10*3/uL (ref 4.0–10.5)

## 2017-01-09 LAB — PREPARE RBC (CROSSMATCH)

## 2017-01-09 LAB — PREGNANCY, URINE: PREG TEST UR: NEGATIVE

## 2017-01-09 SURGERY — MYOMECTOMY, ABDOMINAL APPROACH
Anesthesia: General

## 2017-01-09 MED ORDER — KETOROLAC TROMETHAMINE 30 MG/ML IJ SOLN
INTRAMUSCULAR | Status: AC
  Filled 2017-01-09: qty 1

## 2017-01-09 MED ORDER — LIDOCAINE HCL (CARDIAC) 20 MG/ML IV SOLN
INTRAVENOUS | Status: AC
  Filled 2017-01-09: qty 5

## 2017-01-09 MED ORDER — FENTANYL CITRATE (PF) 100 MCG/2ML IJ SOLN
INTRAMUSCULAR | Status: AC
  Filled 2017-01-09: qty 2

## 2017-01-09 MED ORDER — VASOPRESSIN 20 UNIT/ML IV SOLN
INTRAVENOUS | Status: DC | PRN
  Administered 2017-01-09: 48 mL via INTRAMUSCULAR

## 2017-01-09 MED ORDER — DEXAMETHASONE SODIUM PHOSPHATE 10 MG/ML IJ SOLN
INTRAMUSCULAR | Status: DC | PRN
  Administered 2017-01-09: 10 mg via INTRAVENOUS

## 2017-01-09 MED ORDER — 0.9 % SODIUM CHLORIDE (POUR BTL) OPTIME
TOPICAL | Status: DC | PRN
  Administered 2017-01-09 (×2): 1000 mL

## 2017-01-09 MED ORDER — PROPOFOL 10 MG/ML IV BOLUS
INTRAVENOUS | Status: AC
  Filled 2017-01-09: qty 20

## 2017-01-09 MED ORDER — SCOPOLAMINE 1 MG/3DAYS TD PT72
1.0000 | MEDICATED_PATCH | Freq: Once | TRANSDERMAL | Status: DC
  Administered 2017-01-09: 1.5 mg via TRANSDERMAL

## 2017-01-09 MED ORDER — DEXAMETHASONE SODIUM PHOSPHATE 10 MG/ML IJ SOLN
INTRAMUSCULAR | Status: AC
  Filled 2017-01-09: qty 1

## 2017-01-09 MED ORDER — METOCLOPRAMIDE HCL 5 MG/ML IJ SOLN
INTRAMUSCULAR | Status: AC
  Filled 2017-01-09: qty 2

## 2017-01-09 MED ORDER — LACTATED RINGERS IV SOLN
INTRAVENOUS | Status: DC | PRN
  Administered 2017-01-09 (×2): via INTRAVENOUS

## 2017-01-09 MED ORDER — KETOROLAC TROMETHAMINE 30 MG/ML IJ SOLN
INTRAMUSCULAR | Status: DC | PRN
  Administered 2017-01-09: 30 mg via INTRAVENOUS

## 2017-01-09 MED ORDER — DIPHENHYDRAMINE HCL 12.5 MG/5ML PO ELIX: 12.5000 mg | ORAL_SOLUTION | Freq: Four times a day (QID) | ORAL | Status: DC | PRN

## 2017-01-09 MED ORDER — LIDOCAINE HCL (CARDIAC) 20 MG/ML IV SOLN
INTRAVENOUS | Status: DC | PRN
  Administered 2017-01-09: 60 mg via INTRAVENOUS

## 2017-01-09 MED ORDER — SUGAMMADEX SODIUM 200 MG/2ML IV SOLN
INTRAVENOUS | Status: AC
  Filled 2017-01-09: qty 2

## 2017-01-09 MED ORDER — SODIUM CHLORIDE 0.9% FLUSH: 9.0000 mL | INTRAVENOUS | Status: DC | PRN

## 2017-01-09 MED ORDER — ROCURONIUM BROMIDE 100 MG/10ML IV SOLN
INTRAVENOUS | Status: DC | PRN
  Administered 2017-01-09: 10 mg via INTRAVENOUS
  Administered 2017-01-09: 40 mg via INTRAVENOUS

## 2017-01-09 MED ORDER — ACETAMINOPHEN 325 MG PO TABS: 650.0000 mg | ORAL_TABLET | Freq: Once | ORAL | Status: DC

## 2017-01-09 MED ORDER — MIDAZOLAM HCL 2 MG/2ML IJ SOLN
INTRAMUSCULAR | Status: AC
  Filled 2017-01-09: qty 2

## 2017-01-09 MED ORDER — SODIUM CHLORIDE 0.9 % IV SOLN: Freq: Once | INTRAVENOUS | Status: DC

## 2017-01-09 MED ORDER — NALOXONE HCL 0.4 MG/ML IJ SOLN: 0.4000 mg | INTRAMUSCULAR | Status: DC | PRN

## 2017-01-09 MED ORDER — DIPHENHYDRAMINE HCL 25 MG PO CAPS: 25.0000 mg | ORAL_CAPSULE | Freq: Once | ORAL | Status: DC

## 2017-01-09 MED ORDER — BUPIVACAINE HCL (PF) 0.5 % IJ SOLN
INTRAMUSCULAR | Status: AC
  Filled 2017-01-09: qty 30

## 2017-01-09 MED ORDER — FENTANYL CITRATE (PF) 100 MCG/2ML IJ SOLN
INTRAMUSCULAR | Status: DC | PRN
  Administered 2017-01-09 (×6): 50 ug via INTRAVENOUS

## 2017-01-09 MED ORDER — ONDANSETRON HCL 4 MG/2ML IJ SOLN
INTRAMUSCULAR | Status: AC
  Filled 2017-01-09: qty 2

## 2017-01-09 MED ORDER — FENTANYL CITRATE (PF) 100 MCG/2ML IJ SOLN
25.0000 ug | INTRAMUSCULAR | Status: DC | PRN
  Administered 2017-01-09: 25 ug via INTRAVENOUS
  Administered 2017-01-09: 50 ug via INTRAVENOUS
  Administered 2017-01-09: 25 ug via INTRAVENOUS

## 2017-01-09 MED ORDER — ONDANSETRON HCL 4 MG/2ML IJ SOLN: 4.0000 mg | Freq: Four times a day (QID) | INTRAMUSCULAR | Status: DC | PRN

## 2017-01-09 MED ORDER — CEFAZOLIN SODIUM-DEXTROSE 2-4 GM/100ML-% IV SOLN
2.0000 g | INTRAVENOUS | Status: AC
  Administered 2017-01-09: 2 g via INTRAVENOUS

## 2017-01-09 MED ORDER — PROMETHAZINE HCL 25 MG/ML IJ SOLN: 6.2500 mg | INTRAMUSCULAR | Status: DC | PRN

## 2017-01-09 MED ORDER — LACTATED RINGERS IV SOLN
INTRAVENOUS | Status: DC
  Administered 2017-01-09 (×2): via INTRAVENOUS

## 2017-01-09 MED ORDER — LACTATED RINGERS IV SOLN
INTRAVENOUS | Status: DC
  Administered 2017-01-10: 01:00:00 via INTRAVENOUS

## 2017-01-09 MED ORDER — METOCLOPRAMIDE HCL 5 MG/ML IJ SOLN
INTRAMUSCULAR | Status: DC | PRN
  Administered 2017-01-09: 10 mg via INTRAVENOUS

## 2017-01-09 MED ORDER — SCOPOLAMINE 1 MG/3DAYS TD PT72
MEDICATED_PATCH | TRANSDERMAL | Status: AC
  Administered 2017-01-09: 1.5 mg via TRANSDERMAL
  Filled 2017-01-09: qty 1

## 2017-01-09 MED ORDER — SODIUM CHLORIDE 0.9 % IJ SOLN
INTRAMUSCULAR | Status: AC
  Filled 2017-01-09: qty 100

## 2017-01-09 MED ORDER — DIPHENHYDRAMINE HCL 50 MG/ML IJ SOLN: 12.5000 mg | Freq: Four times a day (QID) | INTRAMUSCULAR | Status: DC | PRN

## 2017-01-09 MED ORDER — ONDANSETRON HCL 4 MG/2ML IJ SOLN
INTRAMUSCULAR | Status: DC | PRN
  Administered 2017-01-09: 4 mg via INTRAVENOUS

## 2017-01-09 MED ORDER — SUGAMMADEX SODIUM 200 MG/2ML IV SOLN
INTRAVENOUS | Status: DC | PRN
  Administered 2017-01-09: 200 mg via INTRAVENOUS

## 2017-01-09 MED ORDER — MIDAZOLAM HCL 2 MG/2ML IJ SOLN
INTRAMUSCULAR | Status: DC | PRN
  Administered 2017-01-09: 2 mg via INTRAVENOUS

## 2017-01-09 MED ORDER — VASOPRESSIN 20 UNIT/ML IV SOLN
INTRAVENOUS | Status: AC
  Filled 2017-01-09: qty 2

## 2017-01-09 MED ORDER — VASOPRESSIN 20 UNIT/ML IV SOLN
INTRAVENOUS | Status: AC
  Filled 2017-01-09: qty 1

## 2017-01-09 MED ORDER — HYDROMORPHONE 1 MG/ML IV SOLN
INTRAVENOUS | Status: DC
  Administered 2017-01-09: 17:00:00 via INTRAVENOUS
  Administered 2017-01-09: 1.5 mg via INTRAVENOUS
  Administered 2017-01-10: 0.3 mg via INTRAVENOUS
  Administered 2017-01-10: 0.9 mg via INTRAVENOUS
  Filled 2017-01-09 (×2): qty 25

## 2017-01-09 MED ORDER — PROPOFOL 10 MG/ML IV BOLUS
INTRAVENOUS | Status: DC | PRN
  Administered 2017-01-09: 180 mg via INTRAVENOUS
  Administered 2017-01-09: 50 mg via INTRAVENOUS

## 2017-01-09 SURGICAL SUPPLY — 36 items
BARRIER ADHS 3X4 INTERCEED (GAUZE/BANDAGES/DRESSINGS) ×2 IMPLANT
CANISTER SUCT 3000ML PPV (MISCELLANEOUS) ×2 IMPLANT
CONT PATH 16OZ SNAP LID 3702 (MISCELLANEOUS) ×2 IMPLANT
DECANTER SPIKE VIAL GLASS SM (MISCELLANEOUS) IMPLANT
DURAPREP 26ML APPLICATOR (WOUND CARE) ×2 IMPLANT
GAUZE SPONGE 4X4 16PLY XRAY LF (GAUZE/BANDAGES/DRESSINGS) ×2 IMPLANT
GLOVE BIOGEL PI IND STRL 6.5 (GLOVE) ×1 IMPLANT
GLOVE BIOGEL PI IND STRL 7.0 (GLOVE) ×1 IMPLANT
GLOVE BIOGEL PI INDICATOR 6.5 (GLOVE) ×1
GLOVE BIOGEL PI INDICATOR 7.0 (GLOVE) ×1
GLOVE SURG SS PI 6.0 STRL IVOR (GLOVE) ×2 IMPLANT
GOWN STRL REUS W/TWL LRG LVL3 (GOWN DISPOSABLE) ×6 IMPLANT
HEMOSTAT ARISTA ABSORB 3G PWDR (MISCELLANEOUS) ×2 IMPLANT
HEMOSTAT SURGICEL 4X8 (HEMOSTASIS) ×2 IMPLANT
NEEDLE HYPO 22GX1.5 SAFETY (NEEDLE) ×2 IMPLANT
NS IRRIG 1000ML POUR BTL (IV SOLUTION) ×2 IMPLANT
PACK ABDOMINAL GYN (CUSTOM PROCEDURE TRAY) ×2 IMPLANT
PAD OB MATERNITY 4.3X12.25 (PERSONAL CARE ITEMS) ×2 IMPLANT
PENCIL SMOKE EVAC W/HOLSTER (ELECTROSURGICAL) IMPLANT
PROTECTOR NERVE ULNAR (MISCELLANEOUS) ×2 IMPLANT
RTRCTR C-SECT PINK 25CM LRG (MISCELLANEOUS) ×2 IMPLANT
SPONGE LAP 18X18 X RAY DECT (DISPOSABLE) ×4 IMPLANT
STAPLER VISISTAT 35W (STAPLE) IMPLANT
SUT VIC AB 0 CT1 18XCR BRD8 (SUTURE) ×2 IMPLANT
SUT VIC AB 0 CT1 27 (SUTURE) ×2
SUT VIC AB 0 CT1 27XBRD ANBCTR (SUTURE) ×2 IMPLANT
SUT VIC AB 0 CT1 36 (SUTURE) ×8 IMPLANT
SUT VIC AB 0 CT1 8-18 (SUTURE) ×2
SUT VIC AB 2-0 CT1 27 (SUTURE) ×1
SUT VIC AB 2-0 CT1 TAPERPNT 27 (SUTURE) ×1 IMPLANT
SUT VIC AB 4-0 KS 27 (SUTURE) ×2 IMPLANT
SUT VIC AB 4-0 SH 27 (SUTURE)
SUT VIC AB 4-0 SH 27XANBCTRL (SUTURE) IMPLANT
SYR CONTROL 10ML LL (SYRINGE) IMPLANT
TOWEL OR 17X24 6PK STRL BLUE (TOWEL DISPOSABLE) ×4 IMPLANT
TRAY FOLEY CATH SILVER 14FR (SET/KITS/TRAYS/PACK) ×2 IMPLANT

## 2017-01-09 NOTE — Anesthesia Procedure Notes (Signed)
Date/Time: 01/09/2017 12:16 PM Performed by: Manus Gunning, Yanira Tolsma J Pre-anesthesia Checklist: Patient identified, Emergency Drugs available, Suction available, Patient being monitored and Timeout performed Patient Re-evaluated:Patient Re-evaluated prior to induction Oxygen Delivery Method: Circle system utilized Preoxygenation: Pre-oxygenation with 100% oxygen Induction Type: IV induction Ventilation: Mask ventilation without difficulty Laryngoscope Size: Mac and 3 Grade View: Grade I Tube type: Oral Tube size: 7.0 mm Number of attempts: 1 Airway Equipment and Method: Stylet Placement Confirmation: ETT inserted through vocal cords under direct vision,  positive ETCO2 and breath sounds checked- equal and bilateral Secured at: 22 cm Tube secured with: Tape Dental Injury: Teeth and Oropharynx as per pre-operative assessment

## 2017-01-09 NOTE — Anesthesia Preprocedure Evaluation (Addendum)
Anesthesia Evaluation  Patient identified by MRN, date of birth, ID band Patient awake    Reviewed: Allergy & Precautions, NPO status , Patient's Chart, lab work & pertinent test results  Airway Mallampati: I  TM Distance: >3 FB Neck ROM: Full    Dental  (+) Dental Advisory Given, Teeth Intact   Pulmonary neg pulmonary ROS,    Pulmonary exam normal breath sounds clear to auscultation       Cardiovascular negative cardio ROS Normal cardiovascular exam Rhythm:Regular Rate:Normal     Neuro/Psych negative neurological ROS  negative psych ROS   GI/Hepatic Neg liver ROS, GERD  Controlled,  Endo/Other  negative endocrine ROS  Renal/GU negative Renal ROS  negative genitourinary   Musculoskeletal negative musculoskeletal ROS (+)   Abdominal   Peds  Hematology negative hematology ROS (+) anemia ,   Anesthesia Other Findings   Reproductive/Obstetrics Fibroids                           Anesthesia Physical Anesthesia Plan  ASA: II  Anesthesia Plan: General   Post-op Pain Management:    Induction: Intravenous  PONV Risk Score and Plan: 4 or greater and Ondansetron, Dexamethasone, Midazolam, Scopolamine patch - Pre-op and Treatment may vary due to age or medical condition  Airway Management Planned: Oral ETT  Additional Equipment: None  Intra-op Plan:   Post-operative Plan: Extubation in OR  Informed Consent: I have reviewed the patients History and Physical, chart, labs and discussed the procedure including the risks, benefits and alternatives for the proposed anesthesia with the patient or authorized representative who has indicated his/her understanding and acceptance.   Dental advisory given  Plan Discussed with: CRNA  Anesthesia Plan Comments:         Anesthesia Quick Evaluation

## 2017-01-09 NOTE — Op Note (Signed)
Katherine Ingram PROCEDURE DATE: 01/09/2017  PREOPERATIVE DIAGNOSIS: Symptomatic uterine leiomyomata. POSTOPERATIVE DIAGNOSIS: The same PROCEDURE: Abdominal Myomectomy SURGEON:  Dr. Mora Bellman ASSISTANT: Dr. Emeterio Reeve  INDICATIONS: 36 y.o. G0P0 here for abdominal myomectomy for symptomatic uterine leiomyomata.  Risks of surgery were discussed with the patient including but not limited to: bleeding which may require transfusion or reoperation; infection which may require antibiotics; injury to bowel, bladder, ureters or other surrounding organs; need for additional procedures; thromboembolic phenomenon, incisional problems and other postoperative/anesthesia complications. Written informed consent was obtained.    FINDINGS:  Uterine leiomyomata. 29 in number, ranging in size  From 1 to 10 cm.  ANESTHESIA:    General INTRAVENOUS FLUIDS:1200  ml ESTIMATED BLOOD LOSS:400 ml URINE OUTPUT: 150 ml SPECIMENS: Leiomyomata sent to pathology COMPLICATIONS: None immediate  PROCEDURE IN DETAIL:  The patient received IV preoperative antibiotics approximately 30 minutes prior to procedure.  She was then taken to the operating room and general anesthesia was administered without difficulty.  She was placed in dorsal supine position and prepped and draped in a sterile manner.  A Foley catheter was inserted into the bladder and attached to Katherine Ingram drainage.  After an adequate timeout was performed, attention was then turned to the abdomen where a Pfannenstiel incision was made with a scalpel.  This incision was carried down to the fascia using electrocautery.  The fascia was incised in the midline and this incision was extended bilaterally using the Mayo scissors.  Kocher's were applied to the superior aspect of the fascial incision, and the rectus muscles were dissected off bluntly and sharply using the Mayo scissors.  A similar process was carried out at the inferior aspect of the incision.  The rectus  muscles were separated in the midline bluntly and the peritoneum was identified, picked up and incised with the scalpel.  This incision was extended superiorly and inferiorly using the scalpel with good visualization of the bladder and bowel.  Attention was then turned to the uterus where multiple uterine leiomyomata were noted.  The bowel were packed away with moist laparotomy sponges and an abdominal retractor was placed.  The uterus was then delivered up through the incision.  Attention was then turned to the uterine surface where the largest leiomyoma was identified. Vasopressin solution was injected over the surface of the leiomyoma to aid with hemostasis.   A vertical incision was made over the uterine leiomyoma into the leiomyoma, and the capsule was recognized.  Using blunt methods, the leiomyoma was freed from the surrounding myometrial tissue and removed intact.  Other leiomyomata were removed in similar fashion.  After removal of all the leiomyomata which were both on the anterior and posterior aspects of the uterus, the incisions were closed in layers, initiated by using 3-0 Vicryl in figure-of-eight stitches to close the breach into the endometrial cavity.  The deeper layers were also reapproximated using 0 Vicryl running stitches, and the serosa was reapproximated using 0 Vicryl imbricating stitch.  Overall good hemostasis was noted. To ensure persistence of hemostasis, surgicel was applied to over the anterior and posterior aspect of the uterus. The uterus was returned to the abdomen.  The laparotomy sponges were then removed from the abdomen, and an Interceed sheet was placed over the uterus as an adhesive barrier. The abdominal retractor was then removed. Kelly clamps were placed on the peritoneum and the peritoneum was closed using 0 Vicryl running stitch.  The fascia was reapproximated with 0 Vicryl running stitch. The patient tolerated the  procedure well. There were no complications during this  case.  Sponge, lap, needle and instrument counts were correct x2.  The patient was taken to the recovery room extubated and in stable condition. Given the extent of the uterine dissection to remove the 29 fibroids, the patient was advised to have a cesarean section with her pregnancies.

## 2017-01-09 NOTE — Transfer of Care (Signed)
Immediate Anesthesia Transfer of Care Note  Patient: Katherine Ingram  Procedure(s) Performed: ABDOMINAL MYOMECTOMY (N/A )  Patient Location: PACU  Anesthesia Type:General  Level of Consciousness: awake  Airway & Oxygen Therapy: Patient Spontanous Breathing and Patient connected to nasal cannula oxygen  Post-op Assessment: Report given to RN and Post -op Vital signs reviewed and stable  Post vital signs: Reviewed and stable  Last Vitals:  Vitals:   01/09/17 1110  BP: (!) 130/99  Pulse: 87  Resp: 16  Temp: 36.7 C  SpO2: 100%    Last Pain:  Vitals:   01/09/17 1110  TempSrc: Oral      Patients Stated Pain Goal: 4 (39/53/20 2334)  Complications: No apparent anesthesia complications

## 2017-01-09 NOTE — Anesthesia Procedure Notes (Signed)
Procedure Name: Intubation Date/Time: 01/09/2017 12:16 PM Performed by: Manus Gunning, Celisse Ciulla J Pre-anesthesia Checklist: Patient identified, Emergency Drugs available, Suction available, Patient being monitored and Timeout performed Patient Re-evaluated:Patient Re-evaluated prior to induction Oxygen Delivery Method: Circle system utilized Preoxygenation: Pre-oxygenation with 100% oxygen Induction Type: IV induction Ventilation: Mask ventilation without difficulty Laryngoscope Size: Mac and 3 Grade View: Grade I Tube type: Oral Tube size: 7.0 mm Number of attempts: 1 Airway Equipment and Method: Stylet Placement Confirmation: ETT inserted through vocal cords under direct vision,  positive ETCO2 and breath sounds checked- equal and bilateral Secured at: 21 cm Tube secured with: Tape Dental Injury: Teeth and Oropharynx as per pre-operative assessment

## 2017-01-09 NOTE — Anesthesia Postprocedure Evaluation (Signed)
Anesthesia Post Note  Patient: Katherine Ingram  Procedure(s) Performed: ABDOMINAL MYOMECTOMY (N/A )     Patient location during evaluation: PACU Anesthesia Type: General Level of consciousness: awake and alert Pain management: pain level controlled Vital Signs Assessment: post-procedure vital signs reviewed and stable Respiratory status: spontaneous breathing, nonlabored ventilation, respiratory function stable and patient connected to nasal cannula oxygen Cardiovascular status: blood pressure returned to baseline and stable Postop Assessment: no apparent nausea or vomiting Anesthetic complications: no    Last Vitals:  Vitals:   01/09/17 1500 01/09/17 1530  BP: (!) 128/106   Pulse: (!) 30 (!) 18  Resp: (!) 28 18  Temp: 36.8 C   SpO2: 99%     Last Pain:  Vitals:   01/09/17 1110  TempSrc: Oral   Pain Goal: Patients Stated Pain Goal: 4 (01/09/17 1110)               Audry Pili

## 2017-01-09 NOTE — Anesthesia Procedure Notes (Signed)
Date/Time: 01/09/2017 2:52 PM Performed by: Purvis Kilts

## 2017-01-10 ENCOUNTER — Encounter (HOSPITAL_COMMUNITY): Payer: Self-pay | Admitting: Obstetrics and Gynecology

## 2017-01-10 LAB — CBC
HCT: 25.7 % — ABNORMAL LOW (ref 36.0–46.0)
Hemoglobin: 7.8 g/dL — ABNORMAL LOW (ref 12.0–15.0)
MCH: 22 pg — ABNORMAL LOW (ref 26.0–34.0)
MCHC: 30.4 g/dL (ref 30.0–36.0)
MCV: 72.4 fL — ABNORMAL LOW (ref 78.0–100.0)
Platelets: 291 K/uL (ref 150–400)
RBC: 3.55 MIL/uL — ABNORMAL LOW (ref 3.87–5.11)
WBC: 18.2 K/uL — ABNORMAL HIGH (ref 4.0–10.5)

## 2017-01-10 MED ORDER — IBUPROFEN 600 MG PO TABS
600.0000 mg | ORAL_TABLET | Freq: Four times a day (QID) | ORAL | Status: DC | PRN
  Administered 2017-01-10 (×2): 600 mg via ORAL
  Filled 2017-01-10 (×3): qty 1

## 2017-01-10 MED ORDER — OXYCODONE-ACETAMINOPHEN 5-325 MG PO TABS
1.0000 | ORAL_TABLET | Freq: Four times a day (QID) | ORAL | Status: DC | PRN
  Administered 2017-01-10 – 2017-01-11 (×4): 1 via ORAL
  Filled 2017-01-10 (×5): qty 1

## 2017-01-10 NOTE — Progress Notes (Signed)
1 Day Post-Op Procedure(s) (LRB): ABDOMINAL MYOMECTOMY (N/A)  Subjective: Patient reports doing well this morning with her pain being well controlled. She denies nausea or emesis and tolerated a baked potato yesterday evening. She denies chest pain, SOB, lightheadedness/dizziness    Objective: I have reviewed patient's vital signs, intake and output, medications and labs.  General: alert, cooperative and no distress Resp: clear to auscultation bilaterally Cardio: regular rate and rhythm, S1, S2 normal, no murmur, click, rub or gallop GI: normal findings: bowel sounds normal and soft, non distended, appropriately tender and incision: clean, dry and intact Extremities: Homans sign is negative, no sign of DVT and no edema, redness or tenderness in the calves or thighs  Assessment: s/p Procedure(s): ABDOMINAL MYOMECTOMY (N/A): stable and progressing well  Plan: Encourage ambulation Advance to PO medication Discontinue IV fluids  Discontinue foley catheter  LOS: 1 day    Cris Talavera 01/10/2017, 7:24 AM

## 2017-01-11 MED ORDER — DOCUSATE SODIUM 100 MG PO CAPS: 100.0000 mg | ORAL_CAPSULE | Freq: Two times a day (BID) | ORAL | 2 refills | Status: DC | PRN

## 2017-01-11 MED ORDER — IBUPROFEN 600 MG PO TABS: 600.0000 mg | ORAL_TABLET | Freq: Four times a day (QID) | ORAL | 3 refills | Status: DC | PRN

## 2017-01-11 MED ORDER — OXYCODONE-ACETAMINOPHEN 5-325 MG PO TABS: 1.0000 | ORAL_TABLET | Freq: Four times a day (QID) | ORAL | 0 refills | Status: DC | PRN

## 2017-01-11 MED ORDER — FERROUS SULFATE 325 (65 FE) MG PO TABS: 325.0000 mg | ORAL_TABLET | Freq: Two times a day (BID) | ORAL | 1 refills | Status: DC

## 2017-01-11 MED FILL — OXYCOD/ACETAMINOPHEN 5-325M: 5-325 | 4 days supply | Qty: 30 | Fill #0

## 2017-01-11 NOTE — Discharge Summary (Signed)
    OB Discharge Summary     Patient Name: Katherine Ingram DOB: October 24, 1980 MRN: [id number]  Date of admission: 01/09/2017 Delivering MD: This patient has no babies on file.  Date of discharge: 01/11/2017  Admitting diagnosis: Fibroids Intrauterine pregnancy: Unknown     Secondary diagnosis:  Active Problems:   Intramural, submucous, and subserous leiomyoma of uterus   S/P myomectomy   Discharge diagnosis: s/p abdominal myomectomy                                   Hospital course:  Patient admitted for scheduled abdominal myomectomy due to symptomatic fibroid and anemia (see operative report for details).  Patient had an uncomplicated post-operative course. She remained afebrile, her pain was well controlled. She ambulated and tolerated a regular diet. She was found stable for discharge home. Rx iron supplements and pain management were prescribed. Discharge instructions were reviewed.   Physical exam  Vitals:   01/10/17 1628 01/10/17 1942 01/10/17 2320 01/11/17 0337  BP: 104/65 109/68 97/66 107/69  Pulse: 90 99 90 (!) 107  Resp: 16 17 16 16   Temp: 99.3 F (37.4 C) 98.5 F (36.9 C) 97.9 F (36.6 C) 98.1 F (36.7 C)  TempSrc: Oral Oral Oral Oral  SpO2: 100% 100% 100% 100%  Weight:      Height:       General: alert, cooperative and no distress Abdomen: soft, appropriately tender, non distended Incision: Dressing is clean, dry, and intact DVT Evaluation: No evidence of DVT seen on physical exam. Negative Homan's sign. No cords or calf tenderness. Labs: Lab Results  Component Value Date   WBC 18.2 (H) 01/10/2017   HGB 7.8 (L) 01/10/2017   HCT 25.7 (L) 01/10/2017   MCV 72.4 (L) 01/10/2017   PLT 291 01/10/2017   No flowsheet data found.  Discharge instruction: per After Visit Summary and "Baby and Me Booklet".  After visit meds:  Allergies as of 01/11/2017   No Known Allergies     Medication List    STOP taking these medications   megestrol 40 MG tablet Commonly  known as:  MEGACE   tranexamic acid 650 MG Tabs tablet Commonly known as:  LYSTEDA     TAKE these medications   cholecalciferol 1000 units tablet Commonly known as:  VITAMIN D Take 1,000 Units by mouth daily.   docusate sodium 100 MG capsule Commonly known as:  COLACE Take 1 capsule (100 mg total) by mouth 2 (two) times daily as needed.   ferrous sulfate 325 (65 FE) MG tablet Commonly known as:  FERROUSUL Take 1 tablet (325 mg total) by mouth 2 (two) times daily.   ibuprofen 600 MG tablet Commonly known as:  ADVIL,MOTRIN Take 1 tablet (600 mg total) by mouth every 6 (six) hours as needed for fever or headache.   oxyCODONE-acetaminophen 5-325 MG tablet Commonly known as:  PERCOCET/ROXICET Take 1-2 tablets by mouth every 6 (six) hours as needed for moderate pain or severe pain.       Diet: routine diet  Activity: Advance as tolerated. Pelvic rest for 6 weeks.   Outpatient follow up: 4 weeks Follow up Appt:No future appointments. Follow up Visit:No Follow-up on file.   01/11/2017 Mora Bellman, MD

## 2017-01-11 NOTE — Progress Notes (Signed)
Pt d/c home teaching complete 

## 2017-01-11 NOTE — Discharge Instructions (Signed)
Myomectomy, Care After °Refer to this sheet in the next few weeks. These instructions provide you with information on caring for yourself after your procedure. Your health care provider may also give you more specific instructions. Your treatment has been planned according to current medical practices, but problems sometimes occur. Call your health care provider if you have any problems or questions after your procedure. °What can I expect after the procedure? °After your procedure, it is typical to have the following: °· Pain in your abdomen, especially at any incision sites. You will be given pain medicine to control the pain. °· Tiredness. This is a normal part of the recovery process. Your energy level will return to normal over the next several weeks. °· Constipation. °· Vaginal bleeding. This is normal and should stop after 1-2 weeks. ° °Follow these instructions at home: °· Only take over-the-counter or prescription medicines as directed by your health care provider. Avoid aspirin because it can cause bleeding. °· Do not douche, use tampons, or have sexual intercourse until given permission by your health care provider. °· Remove or change any bandages (dressings) as directed by your health care provider. °· Take showers instead of baths as directed by your health care provider. °· You will probably be able to go back to your normal routine after a few days. Do not do anything that requires extra effort until your health care provider says it is okay. Do not lift anything heavier than 15 pounds (6.8 kg) until your health care provider approves. °· Walk daily but take frequent rest breaks if you tire easily. °· Continue to practice deep breathing and coughing. If it hurts to cough, try holding a pillow against your belly as you cough. °· If you become constipated, you may: °? Use a mild laxative if your health care provider approves. °? Add more fruit and bran to your diet. °? Drink enough fluids to keep your  urine clear or pale yellow. °· Take your temperature twice a day and write it down. °· Do not drink alcohol. °· Do not drive until your health care provider approves. °· Have someone help you at home for 1 week or until you can do your own household activities. °· Follow up with your health care provider as directed. °Contact a health care provider if: °· You have a fever. °· You have increasing abdominal pain that is not relieved with medicine. °· You have nausea, vomiting, or diarrhea. °· You have pain when you urinate, or you have blood in your urine. °· You have a rash on your body. °· You have pain or redness where your IV access tube was inserted. °· You have redness, swelling, or any kind of drainage from an incision. °Get help right away if: °· You have weakness or lightheadedness. °· You have pain, swelling, or redness in your legs. °· You have chest pain. °· You faint. °· You have shortness of breath. °· You have heavy vaginal bleeding. °· Your incision is opening up. °This information is not intended to replace advice given to you by your health care provider. Make sure you discuss any questions you have with your health care provider. °Document Released: 08/10/2010 Document Revised: 08/26/2015 Document Reviewed: 10/30/2012 °Elsevier Interactive Patient Education © 2017 [employer] ° °

## 2017-01-13 LAB — TYPE AND SCREEN
ABO/RH(D): O POS
Antibody Screen: NEGATIVE
UNIT DIVISION: 0
UNIT DIVISION: 0
Unit division: 0

## 2017-01-13 LAB — BPAM RBC
BLOOD PRODUCT EXPIRATION DATE: 201811092359
Blood Product Expiration Date: 201811092359
Blood Product Expiration Date: 201811092359
UNIT TYPE AND RH: 5100
UNIT TYPE AND RH: 5100
Unit Type and Rh: 5100

## 2017-01-24 ENCOUNTER — Telehealth: Payer: Self-pay | Admitting: General Practice

## 2017-01-24 NOTE — Telephone Encounter (Signed)
Patient called into front office stating her family has been concerned about her and her pain. Patient states she still has some numbness and stinging with the incision but overall feels pretty good. Patient states some days she doesn't want to get around and move a bunch but has mostly good days. Patient reports she is taking ibuprofen and occasionally percocet as well as iron and stool softeners. Explained to patient that she had a low hemoglobin when she left the hospital, a big surgery & a blood transfusion. Told patient that she is still in the recovery process and I wouldn't expect her to be 100% her normal self at this point as she is still healing. Told patient that some pain is still expected, not severe pain but certainly still some soreness. Reminded patient of appt on 11/6. Patient verbalized understanding to all & had no questions

## 2017-02-06 ENCOUNTER — Ambulatory Visit (INDEPENDENT_AMBULATORY_CARE_PROVIDER_SITE_OTHER): Payer: BLUE CROSS/BLUE SHIELD | Admitting: Obstetrics and Gynecology

## 2017-02-06 ENCOUNTER — Encounter: Payer: Self-pay | Admitting: Obstetrics and Gynecology

## 2017-02-06 VITALS — Wt 130.5 lb

## 2017-02-06 DIAGNOSIS — Z9889 Other specified postprocedural states: Secondary | ICD-10-CM

## 2017-02-06 NOTE — Progress Notes (Signed)
36 yo G0 here for post op check from abdominal myomectomy performed on 10/9. Patient reports doing well and is without complaints. She denies fever, abdominal pain or abnormal drainage from her incision. She has started her menses a few days ago.  Past Medical History:  Diagnosis Date  . Abnormal Pap smear   . Anemia   . GERD (gastroesophageal reflux disease)     NO MEDS, CAN'T SWALLOW BIG PILLS   Past Surgical History:  Procedure Laterality Date  . ANKLE SURGERY     Lt ankle fracture   Family History  Problem Relation Age of Onset  . Diabetes Father   . Scoliosis Sister    Social History   Tobacco Use  . Smoking status: Never Smoker  . Smokeless tobacco: Never Used  Substance Use Topics  . Alcohol use: No  . Drug use: No   ROS See pertinent in HPI  Weight 130 lb 8 oz (59.2 kg), last menstrual period 02/04/2017. GENERAL: Well-developed, well-nourished female in no acute distress.  ABDOMEN: Soft, nontender, nondistended.  Incision: healed well without erythema, induration or drainage PELVIC: Not indicated EXTREMITIES: No cyanosis, clubbing, or edema, 2+ distal pulses.  A/P  36 yo here for post op check - patient is cleared to return to work on 11/26 and to resume all activities of daily living - patient was instructed to start taking prenatal vitamins - RTC prn

## 2017-03-22 ENCOUNTER — Encounter: Payer: Self-pay | Admitting: Obstetrics and Gynecology

## 2017-08-16 ENCOUNTER — Telehealth: Payer: Self-pay | Admitting: Obstetrics and Gynecology

## 2017-08-16 NOTE — Telephone Encounter (Signed)
PT has 14 days to turn in her FMLA papers. Pt called to notify they are faxing the forms today. Pt states it is so she can miss work intermittenly due to heavy menstruation. Pt last seen 02/2017. No appts available within 14 days time frame allowed. Please advise pt.

## 2017-08-23 ENCOUNTER — Encounter: Payer: Self-pay | Admitting: *Deleted

## 2017-08-23 NOTE — Progress Notes (Unsigned)
Received notice from registration that patient needs intermittent FMLA paperwork completed for heavy menstrual cycles.  Patient hasn't been seen in our office since 02/2017.  Appointment scheduled for 09/04/17.  This is the only day Dr. Elly Modena is in our office in June.  I have sent a My Chart message to patient explaining that she must be seen and discuss any possible intermittent FMLA with the doctor before paperwork could possibly be completed.

## 2017-09-04 ENCOUNTER — Ambulatory Visit (INDEPENDENT_AMBULATORY_CARE_PROVIDER_SITE_OTHER): Payer: BLUE CROSS/BLUE SHIELD | Admitting: Obstetrics and Gynecology

## 2017-09-04 ENCOUNTER — Other Ambulatory Visit: Payer: Self-pay

## 2017-09-04 ENCOUNTER — Encounter: Payer: Self-pay | Admitting: Obstetrics and Gynecology

## 2017-09-04 VITALS — BP 137/85 | HR 73 | Wt 166.0 lb

## 2017-09-04 DIAGNOSIS — R102 Pelvic and perineal pain: Secondary | ICD-10-CM | POA: Diagnosis not present

## 2017-09-04 MED ORDER — NORGESTIMATE-ETH ESTRADIOL 0.25-35 MG-MCG PO TABS: 1.0000 | ORAL_TABLET | Freq: Every day | ORAL | 11 refills | Status: DC

## 2017-09-04 MED ORDER — NORGESTIMATE-ETH ESTRADIOL 0.25-35 MG-MCG PO TABS: 1.0000 | ORAL_TABLET | Freq: Every day | ORAL | 5 refills | Status: DC

## 2017-09-04 MED FILL — ESTARYLLA 0.25-35 MG-MCG TA: 0.25-35 | 28 days supply | Qty: 28 | Fill #0

## 2017-09-04 NOTE — Progress Notes (Signed)
37 yo G0 s/p abominal myomectomy 01/2017 presenting today for the evaluation of heavy menses. Patient reports normal menses following myomectomy but starting in January/february the first 2 days of her menses are heavy. She reports soaking through her clothes and being sent home from work. Patient has been taking traxanemic acid with good results. She desires to be started on birth control in order to better manage her flow. Her vaginal bleeding typically last 5-7 days. Patient is also complaining of pelvic pressure and is concerned that she may have new fibroids. She is without any other complaints  Past Medical History:  Diagnosis Date  . Abnormal Pap smear   . Anemia   . GERD (gastroesophageal reflux disease)     NO MEDS, CAN'T SWALLOW BIG PILLS   Past Surgical History:  Procedure Laterality Date  . ANKLE SURGERY     Lt ankle fracture  . MYOMECTOMY N/A 01/09/2017   Procedure: ABDOMINAL MYOMECTOMY;  Surgeon: Mora Bellman, MD;  Location: [facility];  Service: Gynecology;  Laterality: N/A;   Family History  Problem Relation Age of Onset  . Diabetes Father   . Scoliosis Sister    Social History   Tobacco Use  . Smoking status: Never Smoker  . Smokeless tobacco: Never Used  Substance Use Topics  . Alcohol use: No  . Drug use: No   ROS See pertinent in HPI  Blood pressure 137/85, pulse 73, weight 166 lb (75.3 kg), last menstrual period 08/30/2017.  GENERAL: Well-developed, well-nourished female in no acute distress.  ABDOMEN: Soft, nontender, nondistended. No organomegaly. PELVIC: Not performed EXTREMITIES: No cyanosis, clubbing, or edema, 2+ distal pulses.  A/P 37 yo G0 here with menorrhagia and pelvic pressure - Pelvic ultrasound ordered - Rx Sprintec provided. Patient has taken OCP previously without complications - Agreed to intermittent FMLA to 1-2 days/month for 3-4 months. Informed patient that if COC help in managing her heavy flow, FMLA will not be extended - RTC  prn - patient will be informed of ultrasound results

## 2017-09-09 IMAGING — US US TRANSVAGINAL NON-OB
1 series · 15 of 25 positions shown · non-contrast
Comparison: None

CLINICAL DATA: Left lower quadrant abdominal pain, menorrhagia,
uterine fibroid

EXAM:
TRANSABDOMINAL AND TRANSVAGINAL ULTRASOUND OF PELVIS
TECHNIQUE: Both transabdominal and transvaginal ultrasound examinations of the
pelvis were performed. Transabdominal technique was performed for
global imaging of the pelvis including uterus, ovaries, adnexal
regions, and pelvic cul-de-sac. It was necessary to proceed with
endovaginal exam following the transabdominal exam to visualize the
endometrium and bilateral ovaries.

[Series 1: us transvaginal non-ob · 15 of 154 slices shown]
[im 1/154]
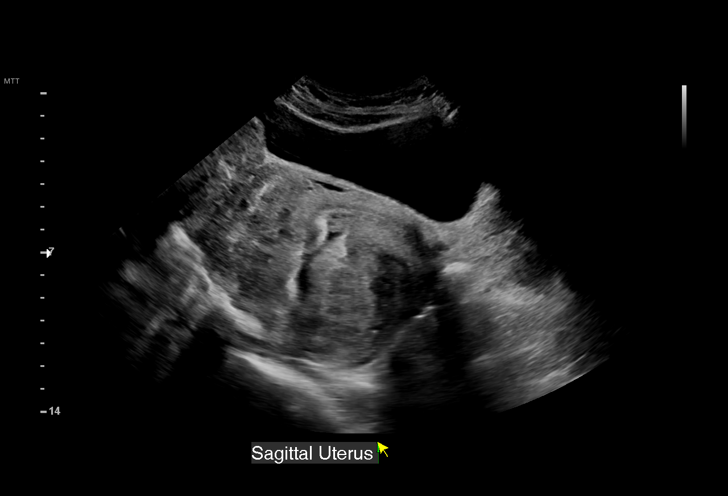
[im 13/154]
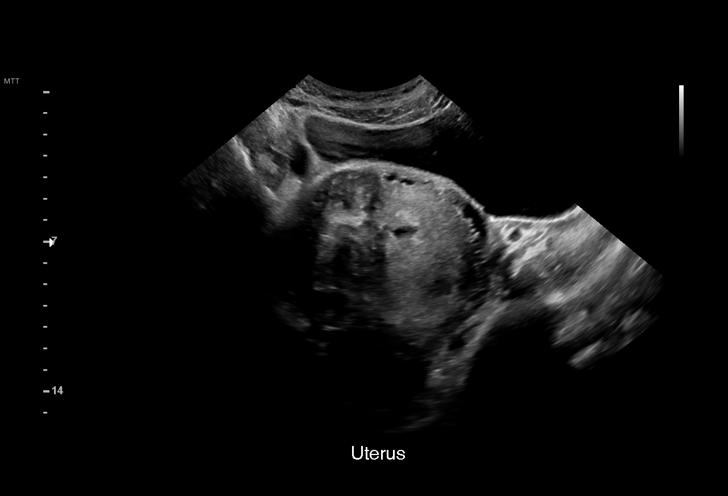
[im 26/154]
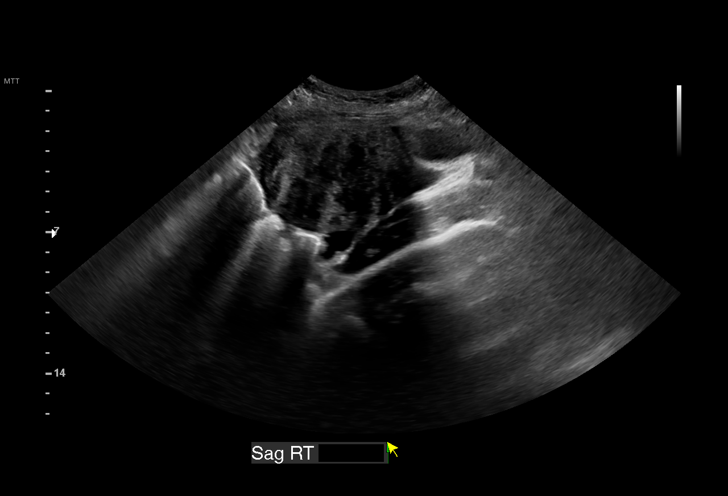
[im 32/154]
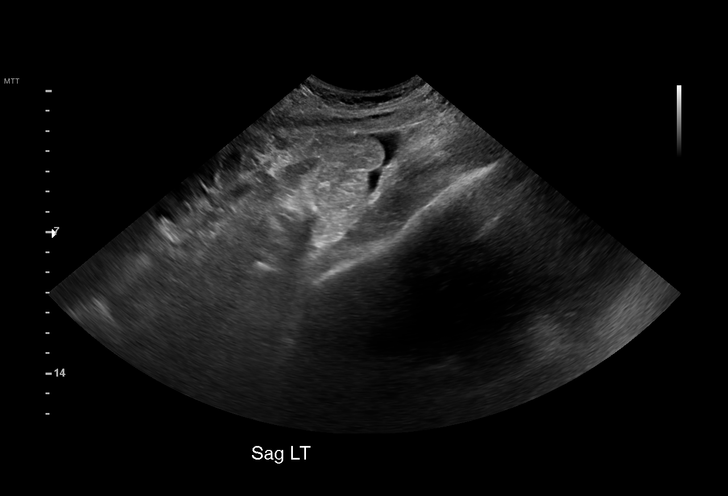
[im 45/154]
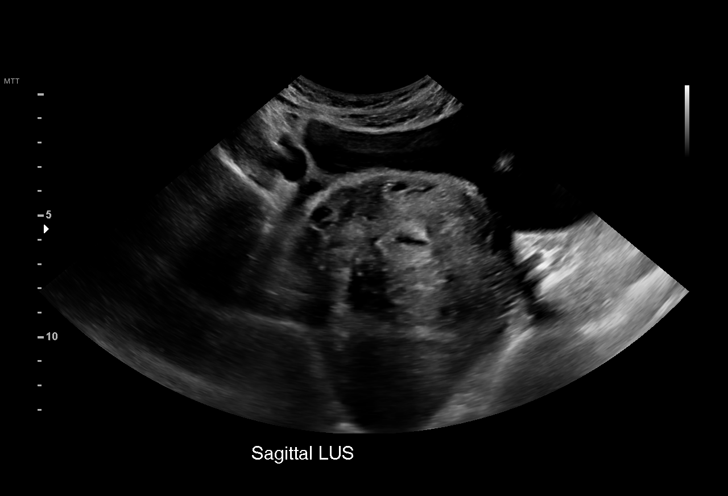
[im 58/154]
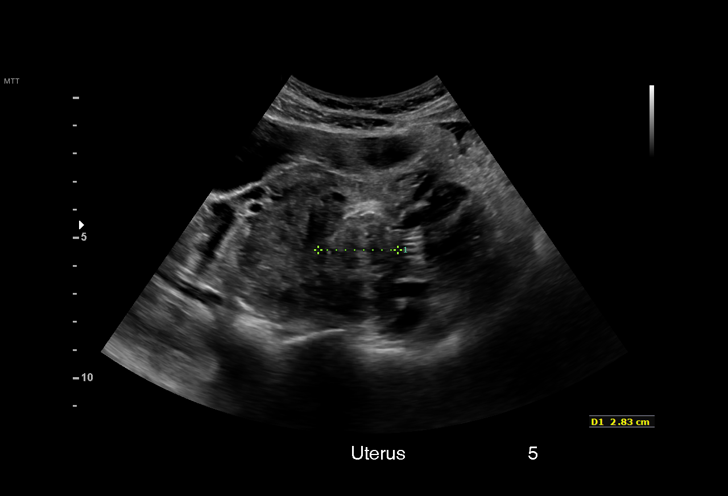
[im 64/154]
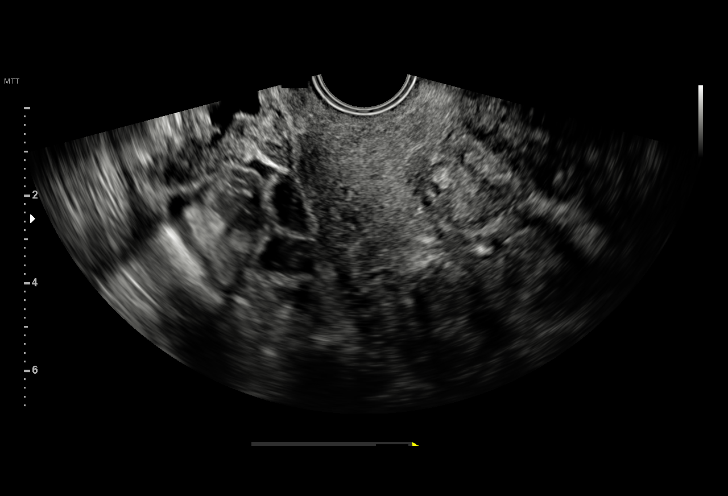
[im 77/154]
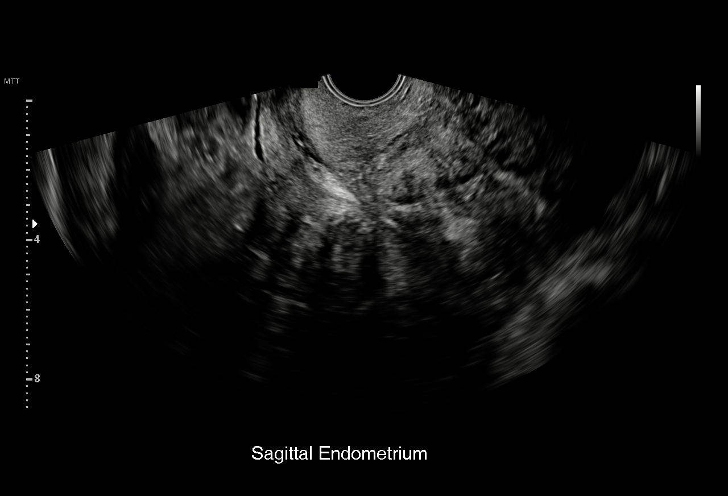
[im 90/154]
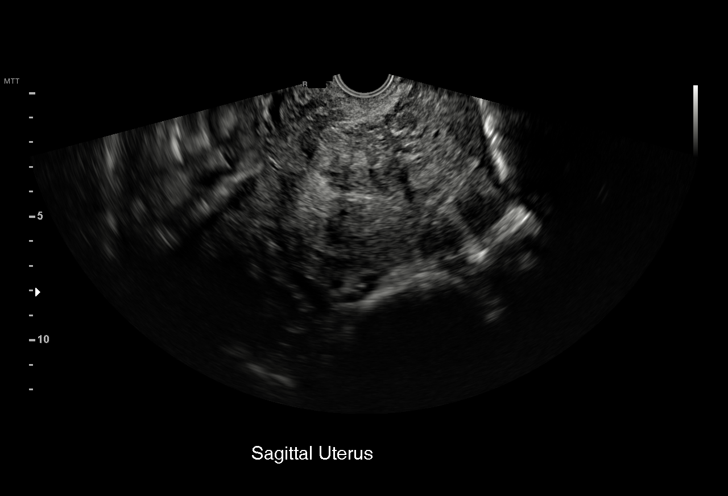
[im 96/154]
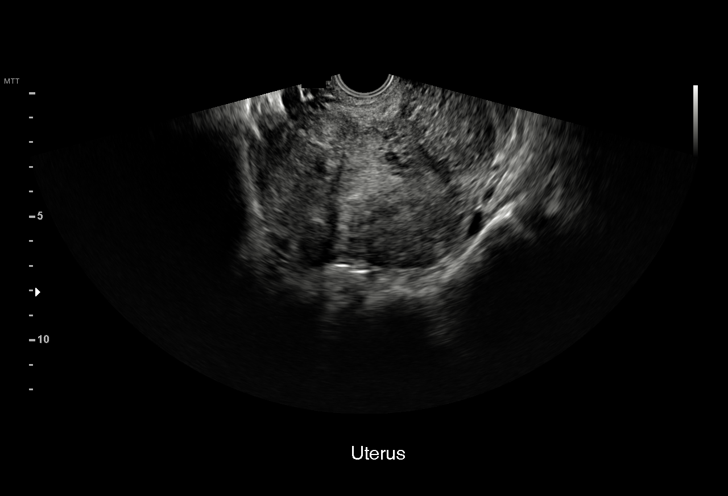
[im 109/154]
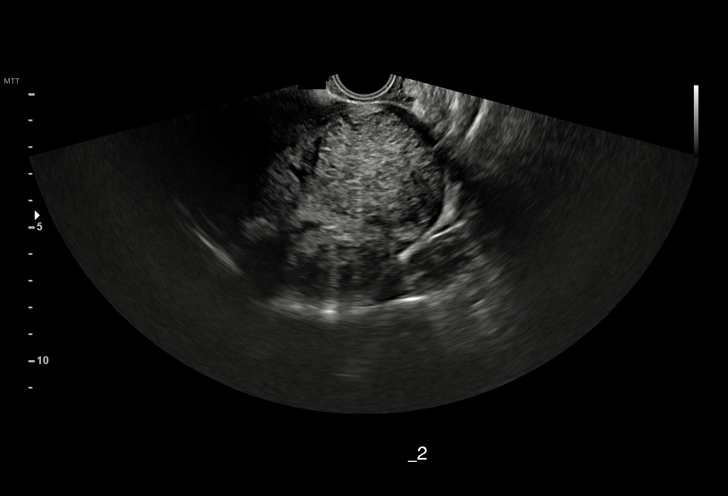
[im 122/154]
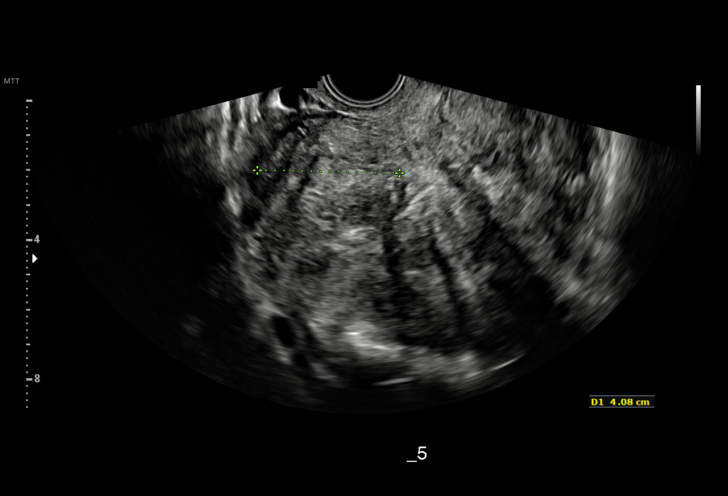
[im 128/154]
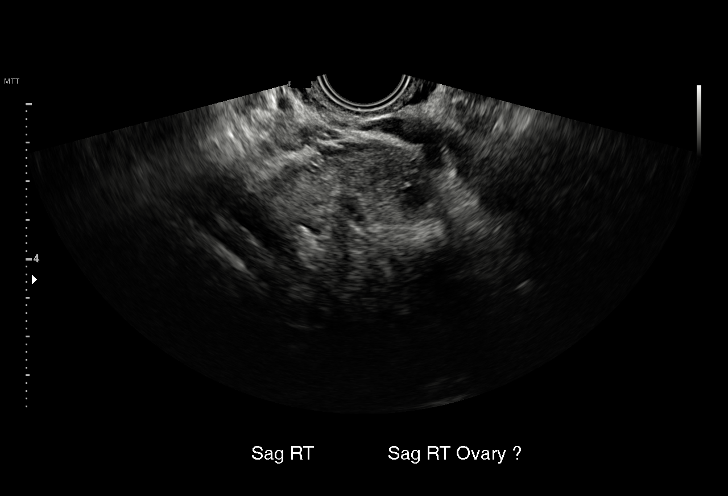
[im 141/154]
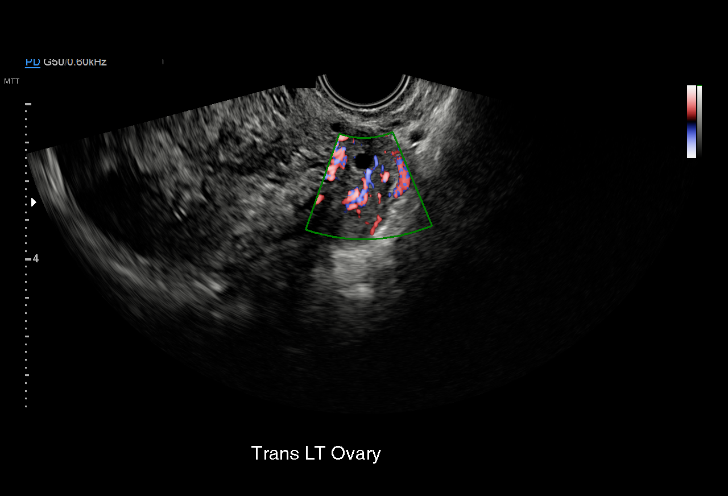
[im 154/154]
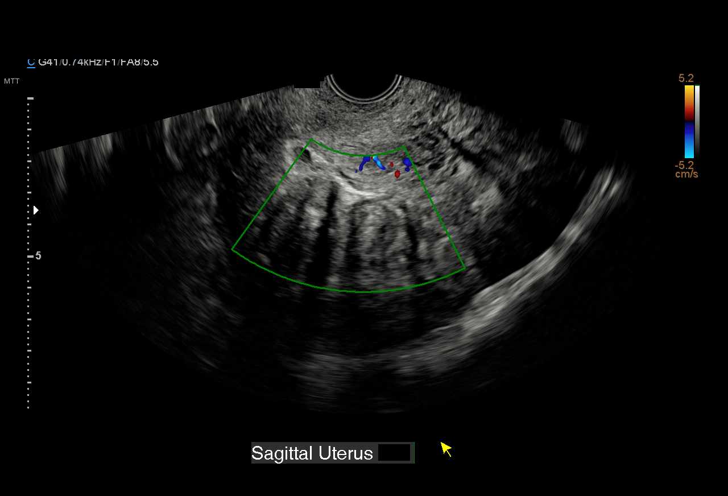

[15 of 25 positions shown; findings below may reference images not displayed]

FINDINGS: Uterus

Measurements: 15.6 x 8.6 x 13.5 cm. Numerous uterine fibroids,
including:

--7.7 x 5.7 x 7.3 cm subserosal right fundal fibroid

-- 7.0 x 4.4 x 5.4 cm subserosal posterior uterine body fibroid

--2.6 x 2.2 x 2.9 cm submucosal fibroid with mucosal component in
the anterior uterine body

Endometrium

Thickness: 19 mm.  No focal abnormality visualized.

Right ovary

Measurements: 2.6 x 2.0 x 1.4 cm. Poorly visualized.

Left ovary

Measurements: 2.9 x 1.6 x 1.7 cm. Poorly visualized.

Other findings

Trace pelvic fluid.
IMPRESSION: Numerous uterine fibroids, including a dominant 7.7 cm subserosal
fibroid in the right uterine fundus and dominant 2.9 cm submucosal
fibroid with mucosal component in the anterior uterine body.

Endometrial complex measures 19 mm, at the upper limits of normal.

Bilateral ovaries are poorly visualized but grossly unremarkable.

## 2017-09-10 ENCOUNTER — Ambulatory Visit (HOSPITAL_COMMUNITY): Payer: BLUE CROSS/BLUE SHIELD

## 2017-09-10 ENCOUNTER — Ambulatory Visit (HOSPITAL_COMMUNITY)
Admission: RE | Admit: 2017-09-10 | Discharge: 2017-09-10 | Disposition: A | Discharge status: Home / Self Care | Payer: BLUE CROSS/BLUE SHIELD | Source: Ambulatory Visit | Attending: Obstetrics and Gynecology | Admitting: Obstetrics and Gynecology

## 2017-09-10 DIAGNOSIS — R102 Pelvic and perineal pain: Secondary | ICD-10-CM | POA: Diagnosis not present

## 2017-09-10 DIAGNOSIS — D251 Intramural leiomyoma of uterus: Secondary | ICD-10-CM | POA: Insufficient documentation

## 2017-09-28 MED FILL — ESTARYLLA 0.25-35 MG-MCG TA: 0.25-35 | 28 days supply | Qty: 28 | Fill #1

## 2017-10-10 ENCOUNTER — Encounter: Payer: Self-pay | Admitting: Obstetrics and Gynecology

## 2017-10-24 MED FILL — FEMYNOR 0.25-35 MG-MCG TABS: 0.25-35 | 28 days supply | Qty: 28 | Fill #2

## 2017-10-25 ENCOUNTER — Other Ambulatory Visit: Payer: Self-pay | Admitting: Obstetrics and Gynecology

## 2017-10-25 DIAGNOSIS — N920 Excessive and frequent menstruation with regular cycle: Secondary | ICD-10-CM

## 2017-11-02 ENCOUNTER — Other Ambulatory Visit: Payer: Self-pay | Admitting: Obstetrics and Gynecology

## 2017-11-02 DIAGNOSIS — N920 Excessive and frequent menstruation with regular cycle: Secondary | ICD-10-CM

## 2017-11-22 ENCOUNTER — Encounter: Payer: Self-pay | Admitting: Obstetrics and Gynecology

## 2017-11-22 ENCOUNTER — Ambulatory Visit (INDEPENDENT_AMBULATORY_CARE_PROVIDER_SITE_OTHER): Payer: BLUE CROSS/BLUE SHIELD | Admitting: Obstetrics and Gynecology

## 2017-11-22 ENCOUNTER — Encounter: Payer: Self-pay | Admitting: Family Medicine

## 2017-11-22 VITALS — BP 143/87 | HR 76 | Ht 65.0 in | Wt 173.0 lb

## 2017-11-22 DIAGNOSIS — Z7689 Persons encountering health services in other specified circumstances: Secondary | ICD-10-CM | POA: Diagnosis not present

## 2017-11-22 DIAGNOSIS — N920 Excessive and frequent menstruation with regular cycle: Secondary | ICD-10-CM

## 2017-11-22 MED ORDER — NORETHIN ACE-ETH ESTRAD-FE 1.5-30 MG-MCG PO TABS: 1.0000 | ORAL_TABLET | Freq: Every day | ORAL | 4 refills | Status: DC

## 2017-11-22 MED FILL — LARIN FE 1.5-30 TABLET: 1.5-30 | 28 days supply | Qty: 28 | Fill #0

## 2017-11-22 NOTE — Progress Notes (Signed)
Scheduled referral to Nutrition for 9/5 @ 2pm

## 2017-11-22 NOTE — Progress Notes (Signed)
37 yo G0 with BMI 28 here for follow up on heavy menses. Patient started [place] in June to help with control of her menorrhagia. She reports a monthly 6-day period with the first 2-3 days being heavy in flow associated with dysmenorrhea. Patient reports no improvement with the birth control pill. She states family members have had similar experienced and reported good results with a different birth control which she desires to try. Patient also researched uterine artery embolization and endometrial ablation. Patient also reports weight gain despite changing her diet. She is interested in information or intervention to assist her with that process.  Past Medical History:  Diagnosis Date  . Abnormal Pap smear   . Anemia   . GERD (gastroesophageal reflux disease)     NO MEDS, CAN'T SWALLOW BIG PILLS   Past Surgical History:  Procedure Laterality Date  . ANKLE SURGERY     Lt ankle fracture  . MYOMECTOMY N/A 01/09/2017   Procedure: ABDOMINAL MYOMECTOMY;  Surgeon: Mora Bellman, MD;  Location: [facility];  Service: Gynecology;  Laterality: N/A;   Family History  Problem Relation Age of Onset  . Diabetes Father   . Scoliosis Sister    Social History   Tobacco Use  . Smoking status: Never Smoker  . Smokeless tobacco: Never Used  Substance Use Topics  . Alcohol use: No  . Drug use: No   ROS See pertinent in HPI  GENERAL: Well-developed, well-nourished female in no acute distress.  NEURO: alert and oriented x 3 EXTREMITIES: No cyanosis, clubbing, or edema, 2+ distal pulses.  09/2017 ultrasound FINDINGS: Uterus  Measurements: 9.0 x 5.7 x 6.9 cm. Retroverted. Decreased number of fibroids is seen compared to prior study. Diffusely heterogeneous myometrial echogenicity noted. Several small intramural fibroids persist, which range in size from 1.6 cm to 2.4 cm. A tiny sub-cm myometrial calcification is also noted.  Endometrium  Thickness: 8 mm.  No focal abnormality  visualized.  Right ovary  Measurements: 2.1 x 1.7 x 1.6 cm. Normal appearance/no adnexal mass.  Left ovary  Measurements: 3.3 x 1.7 x 1.8 cm. Normal appearance/no adnexal mass.  Other findings:  No abnormal free fluid  IMPRESSION: Decreased number of fibroids status post myomectomy. Several small intramural fibroids again seen, largest measuring 2.4 cm.  Normal appearance of both ovaries.  No acute findings identified.   Electronically Signed   By: Earle Gell M.D.   On: 09/10/2017 15:48  A/P 37 yo with menorrhagia with regular menses  - Rx Blisovi provided per patient request - Discussed [place] and endometrial ablation. Patient is not a candidate for [place] give size of uterus and fibroid. Informed patient that endomtrial ablation prior to pregnancy is not recommended as it can severely impact her fertility. Patient verbalized understanding and plans to continue with medical management - Will check CBC and TSH - patient with 7lb weight gain since her last visit in June - advised patient to increase physical activity level to include 150 minutes/week of exercise (combination of both cardio and weight training). Also advised patient to decrease refined sugar intake - Patient referred to nutritionist.

## 2017-11-23 LAB — CBC
Hematocrit: 46.9 % — ABNORMAL HIGH (ref 34.0–46.6)
Hemoglobin: 15.3 g/dL (ref 11.1–15.9)
MCH: 28.5 pg (ref 26.6–33.0)
MCHC: 32.6 g/dL (ref 31.5–35.7)
MCV: 88 fL (ref 79–97)
Platelets: 443 10*3/uL (ref 150–450)
RBC: 5.36 x10E6/uL — AB (ref 3.77–5.28)
RDW: 13.2 % (ref 12.3–15.4)
WBC: 10.2 10*3/uL (ref 3.4–10.8)

## 2017-11-23 LAB — TSH: TSH: 0.894 u[IU]/mL (ref 0.450–4.500)

## 2017-12-06 ENCOUNTER — Encounter: Payer: Self-pay | Admitting: Dietician

## 2017-12-06 ENCOUNTER — Encounter: Payer: BLUE CROSS/BLUE SHIELD | Attending: Obstetrics and Gynecology | Admitting: Dietician

## 2017-12-06 DIAGNOSIS — Z713 Dietary counseling and surveillance: Secondary | ICD-10-CM | POA: Insufficient documentation

## 2017-12-06 DIAGNOSIS — E663 Overweight: Secondary | ICD-10-CM

## 2017-12-06 DIAGNOSIS — Z7689 Persons encountering health services in other specified circumstances: Secondary | ICD-10-CM | POA: Insufficient documentation

## 2017-12-06 NOTE — Progress Notes (Signed)
  Medical Nutrition Therapy:  Appt start time: 1400 end time:  1500.   Assessment:  Primary concerns today: overweight.  Patient was on a steroid medication and reports weight gain of 40 lbs.  She desires weight loss, has started and exercise routine and has began to make small changes to her eating habits.  She reports drinking more water, trying to cut back on sweets and sodas, and choosing more grilled foods over fried foods.  She does the food shopping and cooking for herself and states that she is a heavy snacker with a sweet tooth.  Preferred Learning Style:  No preference indicated   Learning Readiness:  Ready  Change in progress   MEDICATIONS: see list   DIETARY INTAKE:  Usual eating pattern includes 2-3 meals and 3-4 snacks per day.  Everyday foods include chocolate, eggs.  Avoided foods include salads, tuna or chicken salad, some veggies.    24-hr recall:  B ( AM): 2-3 boiled eggs sometimes with 2 pieces of bacon, or yogurt or jello or crackers  Snk ( AM): fruit L ( PM): out to eat like [place] food, [employer] Snk ( PM): nutty butty bar or reese cup or oreos D ( PM): ravioli or hot dogs or spaghetti or bacon and eggs or rice and eggs Snk ( PM): doritos or pringles Beverages: 1-2 Pepsi or Mtn Dew per day, 2-3 bottled waters per day, fruit juice  Usual physical activity: 1 hour workout at the gym after work 1x/week, walking 15 min in the morning or on her work break 5x/week   Progress Towards Goal(s):  In progress.   Nutritional Diagnosis:  NB-1.1 Food and nutrition-related knowledge deficit As related to no prior formal nutrition education.  As evidenced by patient request for nutrition advice for sustainable weight loss.    Intervention:  Nutrition education and counseling.  Discussed her weight loss goals and safe, sustainable rate of weight loss.  Reviewed her diet recall with her and discussed meal timing, appetite management, food environment at home and at  work, portion sizes, balanced meals utilizing the portion plate, and goal setting.  Provided and reviewed handout with sample meal ideas.  Discussed her exercise routine and recommended increasing to 150-200 minutes moderate activity per week.  She has been contemplating finding a gym class she can attend to increase her gym visits to 2x/week in addition to her walking.  Discussed her motivation levels.  Answered her nutrition questions.  Teaching Method Utilized:  Visual Auditory  Handouts given during visit include:  Portion Plate Planner  Quick & Easy Meals (30 gram Carbohydrate for Women)  Barriers to learning/adherence to lifestyle change: none  Demonstrated degree of understanding via:  Teach Back   Monitoring/Evaluation:  Dietary intake, exercise, goals, and body weight in 6 month(s).

## 2017-12-18 MED FILL — LARIN FE 1.5-30 TABLET: 1.5-30 | 28 days supply | Qty: 28 | Fill #1

## 2017-12-27 ENCOUNTER — Other Ambulatory Visit: Payer: Self-pay

## 2017-12-27 ENCOUNTER — Emergency Department (HOSPITAL_COMMUNITY)
Admission: EM | Admit: 2017-12-27 | Discharge: 2017-12-27 | Disposition: A | Discharge status: Home / Self Care | Payer: BLUE CROSS/BLUE SHIELD | Attending: Emergency Medicine | Admitting: Emergency Medicine

## 2017-12-27 ENCOUNTER — Encounter (HOSPITAL_COMMUNITY): Payer: Self-pay | Admitting: *Deleted

## 2017-12-27 DIAGNOSIS — Z79899 Other long term (current) drug therapy: Secondary | ICD-10-CM | POA: Insufficient documentation

## 2017-12-27 DIAGNOSIS — R112 Nausea with vomiting, unspecified: Secondary | ICD-10-CM | POA: Diagnosis present

## 2017-12-27 DIAGNOSIS — R1110 Vomiting, unspecified: Secondary | ICD-10-CM

## 2017-12-27 DIAGNOSIS — I10 Essential (primary) hypertension: Secondary | ICD-10-CM

## 2017-12-27 DIAGNOSIS — R110 Nausea: Secondary | ICD-10-CM

## 2017-12-27 DIAGNOSIS — R197 Diarrhea, unspecified: Secondary | ICD-10-CM | POA: Insufficient documentation

## 2017-12-27 HISTORY — DX: Essential (primary) hypertension: I10

## 2017-12-27 LAB — URINALYSIS, ROUTINE W REFLEX MICROSCOPIC
Bilirubin Urine: NEGATIVE
GLUCOSE, UA: NEGATIVE mg/dL
HGB URINE DIPSTICK: NEGATIVE
KETONES UR: NEGATIVE mg/dL
Leukocytes, UA: NEGATIVE
Nitrite: NEGATIVE
PH: 6 (ref 5.0–8.0)
PROTEIN: NEGATIVE mg/dL
Specific Gravity, Urine: 1.009 (ref 1.005–1.030)

## 2017-12-27 LAB — BASIC METABOLIC PANEL
Anion gap: 13 (ref 5–15)
BUN: 15 mg/dL (ref 6–20)
CHLORIDE: 100 mmol/L (ref 98–111)
CO2: 25 mmol/L (ref 22–32)
CREATININE: 1.03 mg/dL — AB (ref 0.44–1.00)
Calcium: 9.9 mg/dL (ref 8.9–10.3)
GFR calc Af Amer: >60 mL/min (ref >60)
GFR calc non Af Amer: >60 mL/min (ref >60)
GLUCOSE: 122 mg/dL — AB (ref 70–99)
Potassium: 4 mmol/L (ref 3.5–5.1)
SODIUM: 138 mmol/L (ref 135–145)

## 2017-12-27 LAB — CBC
HCT: 51.6 % — ABNORMAL HIGH (ref 36.0–46.0)
Hemoglobin: 16.6 g/dL — ABNORMAL HIGH (ref 12.0–15.0)
MCH: 28.5 pg (ref 26.0–34.0)
MCHC: 32.2 g/dL (ref 30.0–36.0)
MCV: 88.7 fL (ref 78.0–100.0)
PLATELETS: 470 10*3/uL — AB (ref 150–400)
RBC: 5.82 MIL/uL — ABNORMAL HIGH (ref 3.87–5.11)
RDW: 13.5 % (ref 11.5–15.5)
WBC: 14.8 10*3/uL — ABNORMAL HIGH (ref 4.0–10.5)

## 2017-12-27 LAB — I-STAT BETA HCG BLOOD, ED (MC, WL, AP ONLY): I-stat hCG, quantitative: <5 m[IU]/mL (ref <5)

## 2017-12-27 MED ORDER — ONDANSETRON 4 MG PO TBDP
8.0000 mg | ORAL_TABLET | Freq: Once | ORAL | Status: AC
  Administered 2017-12-27: 8 mg via ORAL
  Filled 2017-12-27: qty 2

## 2017-12-27 NOTE — ED Triage Notes (Signed)
Pt recently started on HCTZ on Sunday. Has been having NV for the past couple of days. Also reports dizziness and general weakness. Denies pain

## 2017-12-27 NOTE — ED Provider Notes (Signed)
[facility] EMERGENCY DEPARTMENT Provider Note   CSN: [id number] Arrival date & time: 12/27/17  0032     History   Chief Complaint Chief Complaint  Patient presents with  . Hypertension  . Nausea    HPI Katherine Ingram is a 37 y.o. female.  The history is provided by the patient.  Hypertension  This is a new problem. The current episode started more than 2 days ago. The problem occurs daily. The problem has been gradually worsening. Pertinent negatives include no chest pain, no headaches and no shortness of breath. Nothing aggravates the symptoms. Nothing relieves the symptoms.   Patient presents with nausea and hypertension.  She reports last weekend she was incidentally found to have high blood pressure with a mild headache. The following day she went to an urgent care and started hydrochlorothiazide as well as azithromycin for a sinus infection Since that time she been having nausea, began having vomiting diarrhea tonight.  No chest pain or shortness of breath.  No abdominal pain.  She reports feeling extremely anxious.  She checked her blood pressure at home and it was very elevated so she went to be evaluated. Past Medical History:  Diagnosis Date  . Abnormal Pap smear   . Anemia   . GERD (gastroesophageal reflux disease)     NO MEDS, CAN'T SWALLOW BIG PILLS  . Hypertension     Patient Active Problem List   Diagnosis Date Noted  . S/P myomectomy 01/09/2017  . Intramural, submucous, and subserous leiomyoma of uterus   . Anemia 12/29/2016  . Fibroids 11/09/2016  . Menorrhagia 11/09/2016  . HSIL (high grade squamous intraepithelial lesion) on Pap smear of cervix 08/23/2012    Past Surgical History:  Procedure Laterality Date  . ANKLE SURGERY     Lt ankle fracture  . MYOMECTOMY N/A 01/09/2017   Procedure: ABDOMINAL MYOMECTOMY;  Surgeon: Mora Bellman, MD;  Location: [facility];  Service: Gynecology;  Laterality: N/A;     OB History    Gravida  0     Para      Term      Preterm      AB      Living        SAB      TAB      Ectopic      Multiple      Live Births               Home Medications    Prior to Admission medications   Medication Sig Start Date End Date Taking? Authorizing Provider  azithromycin (ZITHROMAX) 250 MG tablet Take 250-500 mg by mouth See admin instructions. Take 500 mg on the first day and then 250 mg for the next 4 days   Yes [provider]  cetirizine (ZYRTEC) 10 MG tablet Take 10 mg by mouth daily.   Yes [provider]  hydrochlorothiazide (MICROZIDE) 12.5 MG capsule Take 12.5 mg by mouth daily.   Yes [provider]  ibuprofen (ADVIL,MOTRIN) 600 MG tablet Take 1 tablet (600 mg total) by mouth every 6 (six) hours as needed for fever or headache. 01/11/17  Yes Constant, Peggy, MD  Multiple Vitamin (MULTIVITAMIN WITH MINERALS) TABS tablet Take 3 tablets by mouth daily.   Yes [provider]  norethindrone-ethinyl estradiol-iron (BLISOVI FE 1.5/30) 1.5-30 MG-MCG tablet Take 1 tablet by mouth daily. 11/22/17  Yes Constant, Peggy, MD  norgestimate-ethinyl estradiol (ORTHO-CYCLEN,SPRINTEC,PREVIFEM) 0.25-35 MG-MCG tablet Take 1 tablet by mouth  daily. Patient not taking: Reported on 12/27/2017 09/04/17   Constant, Peggy, MD    Family History Family History  Problem Relation Age of Onset  . Diabetes Father   . Scoliosis Sister     Social History Social History   Tobacco Use  . Smoking status: Never Smoker  . Smokeless tobacco: Never Used  Substance Use Topics  . Alcohol use: No  . Drug use: No     Allergies   Penicillins   Review of Systems Review of Systems  Constitutional: Negative for fever.  Respiratory: Negative for shortness of breath.   Cardiovascular: Negative for chest pain.  Gastrointestinal: Positive for diarrhea and vomiting.  Neurological: Negative for headaches.  All other systems reviewed and are negative.    Physical  Exam Updated Vital Signs BP (!) 153/107   Pulse 97   Temp 98.2 F (36.8 C)   Resp (!) 21   LMP 12/17/2017   SpO2 100%   Physical Exam CONSTITUTIONAL: Well developed/well nourished HEAD: Normocephalic/atraumatic EYES: EOMI/PERRL ENMT: Mucous membranes moist NECK: supple no meningeal signs SPINE/BACK:entire spine nontender CV: S1/S2 noted, no murmurs/rubs/gallops noted LUNGS: Lungs are clear to auscultation bilaterally, no apparent distress ABDOMEN: soft, nontender, no rebound or guarding, bowel sounds noted throughout abdomen GU:no cva tenderness NEURO: Pt is awake/alert/appropriate, moves all extremitiesx4.  No facial droop.  No arm or leg drift EXTREMITIES: pulses normal/equal, full ROM SKIN: warm, color normal PSYCH: Anxious  ED Treatments / Results  Labs (all labs ordered are listed, but only abnormal results are displayed) Labs Reviewed  BASIC METABOLIC PANEL - Abnormal; Notable for the following components:      Result Value   Glucose, Bld 122 (*)    Creatinine, Ser 1.03 (*)    All other components within normal limits  CBC - Abnormal; Notable for the following components:   WBC 14.8 (*)    RBC 5.82 (*)    Hemoglobin 16.6 (*)    HCT 51.6 (*)    Platelets 470 (*)    All other components within normal limits  URINALYSIS, ROUTINE W REFLEX MICROSCOPIC  I-STAT BETA HCG BLOOD, ED ([facility], [facility], [facility] ONLY)    EKG EKG Interpretation  Date/Time:  Thursday December 27 2017 02:36:50 EDT Ventricular Rate:  97 PR Interval:    QRS Duration: 80 QT Interval:  342 QTC Calculation: 435 R Axis:   54 Text Interpretation:  Sinus rhythm Anteroseptal infarct, old No previous ECGs available Confirmed by Ripley Fraise [phone]) on 12/27/2017 3:24:09 AM   Radiology No results found.  Procedures Procedures (including critical care time)  Medications Ordered in ED Medications  ondansetron (ZOFRAN-ODT) disintegrating tablet 8 mg (8 mg Oral Given 12/27/17 0358)     Initial  Impression / Assessment and Plan / ED Course  I have reviewed the triage vital signs and the nursing notes.  Pertinent labs results that were available during my care of the patient were reviewed by me and considered in my medical decision making (see chart for details).     Patient is likely having intolerance to the hydrochlorthiazide.  She appears extremely anxious, as soon as I walk into the room her heart rate jumped up to 130. After further discussion, patient feels improved. Plan will be to for her hold her hydrochlorothiazide for 1 to 2 weeks Recheck her blood pressure, and if it is elevated above 140/80, she will restart the medicines.  She has a PCP follow-up in 1.5 months. Overall her labs are reassuring.  Suspect  her elevated white count is due to vomiting/diarrhea  Final Clinical Impressions(s) / ED Diagnoses   Final diagnoses:  Nausea  Vomiting and diarrhea  Essential hypertension    ED Discharge Orders    None       Ripley Fraise, MD 12/27/17 [phone]

## 2018-02-05 MED FILL — AMLODIPINE BESYLATE 5 MG TA: 5 | 30 days supply | Qty: 30 | Fill #0

## 2018-06-13 ENCOUNTER — Ambulatory Visit: Payer: BLUE CROSS/BLUE SHIELD | Admitting: Dietician

## 2018-10-21 MED FILL — LARIN FE 1.5-30 TABLET: 1.5-30 | 84 days supply | Qty: 84 | Fill #2

## 2019-11-27 ENCOUNTER — Ambulatory Visit: Payer: Self-pay | Admitting: Obstetrics and Gynecology

## 2019-11-28 ENCOUNTER — Other Ambulatory Visit: Payer: Self-pay

## 2019-11-28 ENCOUNTER — Ambulatory Visit (INDEPENDENT_AMBULATORY_CARE_PROVIDER_SITE_OTHER): Payer: Self-pay | Admitting: Obstetrics and Gynecology

## 2019-11-28 ENCOUNTER — Encounter: Payer: Self-pay | Admitting: Obstetrics and Gynecology

## 2019-11-28 VITALS — BP 134/94 | HR 113

## 2019-11-28 DIAGNOSIS — N939 Abnormal uterine and vaginal bleeding, unspecified: Secondary | ICD-10-CM

## 2019-11-28 DIAGNOSIS — D259 Leiomyoma of uterus, unspecified: Secondary | ICD-10-CM

## 2019-11-28 MED ORDER — MEGESTROL ACETATE 40 MG PO TABS: 40.0000 mg | ORAL_TABLET | Freq: Two times a day (BID) | ORAL | 5 refills | Status: DC

## 2019-11-28 NOTE — Progress Notes (Signed)
39 yo P0 presenting today for the evaluation of AUB. Patient reports 4-5 day monthly period with first 2 days heavy in flow associated with dysmenorrhea. Patient had a myomectomy in 2018. Patient is not currently sexually active but desires to conceive. Patient is without any other complaints. She denies chest pain, SOB, lightheadedness  Past Medical History:  Diagnosis Date  . Abnormal Pap smear   . Anemia   . GERD (gastroesophageal reflux disease)     NO MEDS, CAN'T SWALLOW BIG PILLS  . Hypertension    Past Surgical History:  Procedure Laterality Date  . ANKLE SURGERY     Lt ankle fracture  . MYOMECTOMY N/A 01/09/2017   Procedure: ABDOMINAL MYOMECTOMY;  Surgeon: Mora Bellman, MD;  Location: [facility];  Service: Gynecology;  Laterality: N/A;   Family History  Problem Relation Age of Onset  . Diabetes Father   . Scoliosis Sister    Social History   Tobacco Use  . Smoking status: Never Smoker  . Smokeless tobacco: Never Used  Vaping Use  . Vaping Use: Never used  Substance Use Topics  . Alcohol use: No  . Drug use: No   ROS See pertinent in HPI  Blood pressure (!) 134/94, pulse (!) 113, last menstrual period 11/23/2019. GENERAL: Well-developed, well-nourished female in no acute distress.  ABDOMEN: Soft, nontender, nondistended. No organomegaly. PELVIC: Normal external female genitalia. Uterus is 14-16 weeks in size. No adnexal mass or tenderness. EXTREMITIES: No cyanosis, clubbing, or edema, 2+ distal pulses.  A/P 39 yo with AUB and fibroid uterus - Rx megace provided  - Pelvic ultrasound ordered - Patient will be contacted with results

## 2019-12-01 ENCOUNTER — Other Ambulatory Visit: Payer: Self-pay

## 2019-12-01 ENCOUNTER — Other Ambulatory Visit: Payer: Self-pay | Admitting: *Deleted

## 2019-12-01 DIAGNOSIS — Z124 Encounter for screening for malignant neoplasm of cervix: Secondary | ICD-10-CM

## 2019-12-01 NOTE — Progress Notes (Signed)
Patient: Katherine Ingram           Date of Birth: 01/01/1981           MRN: [id number] Visit Date: 12/01/2019 PCP: Seward Carol, MD  Cervical Cancer Screening Do you smoke?: No Have you ever had or been told you have an allergy to latex products?: No Marital status: Single Date of last pap smear: 2-5 yrs ago Date of last menstrual period: 11/24/19 Number of pregnancies: 0 Number of births: 0 Have you ever had any of the following? Hysterectomy: No Tubal ligation (tubes tied): No Abnormal bleeding: Yes (hx fibroids, myomectomy x 3 years-resolved) Abnormal pap smear: Yes (04/06/2016 HPV+ [facility])) Venereal warts: No A sex partner with venereal warts: No A high risk* sex partner: No  Cervical Exam  Abnormal Observations: Observed cervical polyp at os. Will refer to the [facility] for follow-up. Recommendations: Last Pap smear 04/06/2016 at [facility] normal with positive HPV. Per patient has no history of an abnormal Pap smear. Next Pap smear will be due based on the result of today's Pap smear and HPV typing.      Patient's History Patient Active Problem List   Diagnosis Date Noted  . S/P myomectomy 01/09/2017  . Intramural, submucous, and subserous leiomyoma of uterus   . Anemia 12/29/2016  . Fibroids 11/09/2016  . Menorrhagia 11/09/2016  . HSIL (high grade squamous intraepithelial lesion) on Pap smear of cervix 08/23/2012   Past Medical History:  Diagnosis Date  . Abnormal Pap smear   . Anemia   . GERD (gastroesophageal reflux disease)     NO MEDS, CAN'T SWALLOW BIG PILLS  . Hypertension     Family History  Problem Relation Age of Onset  . Diabetes Father   . Scoliosis Sister     Social History   Occupational History  . Not on file  Tobacco Use  . Smoking status: Never Smoker  . Smokeless tobacco: Never Used  Vaping Use  . Vaping Use: Never used  Substance and Sexual Activity  . Alcohol use: No  . Drug use:  No  . Sexual activity: Never    Birth control/protection: Abstinence

## 2019-12-01 NOTE — Addendum Note (Signed)
Addended by: Demetrius Revel on: 12/01/2019 03:21 PM   Modules accepted: Orders

## 2019-12-02 LAB — CYTOLOGY - PAP
Comment: NEGATIVE
Diagnosis: NEGATIVE
High risk HPV: NEGATIVE

## 2019-12-04 ENCOUNTER — Ambulatory Visit
Admission: RE | Admit: 2019-12-04 | Discharge: 2019-12-04 | Disposition: A | Discharge status: Home / Self Care | Payer: Self-pay | Source: Ambulatory Visit | Attending: Obstetrics and Gynecology | Admitting: Obstetrics and Gynecology

## 2019-12-04 ENCOUNTER — Other Ambulatory Visit: Payer: Self-pay

## 2019-12-04 DIAGNOSIS — N939 Abnormal uterine and vaginal bleeding, unspecified: Secondary | ICD-10-CM | POA: Insufficient documentation

## 2019-12-04 DIAGNOSIS — D259 Leiomyoma of uterus, unspecified: Secondary | ICD-10-CM | POA: Insufficient documentation

## 2019-12-26 ENCOUNTER — Telehealth: Payer: Self-pay | Admitting: *Deleted

## 2019-12-26 NOTE — Telephone Encounter (Signed)
Patient called and left voicemail in regards to a bill she received. Called patient back. Patient received a bill for a transvaginal ultrasound. Patient came to the free cervical cancer screening and was referred to the [facility] to follow up for a cervical polyp observed. Explained to patient that the ultrasound is not covered by the free screening program. Gave patient the information on the [place] and how to access the information. Patient requested [place] to mail the information. Verified patients address and will mail a copy of the application. Patient verbalized understanding.

## 2019-12-26 NOTE — Telephone Encounter (Signed)
Saria left a message this am that she was recently seen by Dr. Elly Modena . States she started her period  And has been bleeding on and off and is on her 13th day.  States she started her new medication and knows this is not normal. Wants to know what her options are before her appointment 01/20/20.  Alexie Samson,RN

## 2019-12-29 NOTE — Telephone Encounter (Signed)
Called pt and discussed her concerns. She stated that she is only having brown spotting now and is currently not taking the Megestrol. I advised this amount of bleeding is not worrisome although I understand it is not desired. I encouraged pt to discuss her pattern of bleeding in detail with Dr. Elly Modena @ visit on 10/19. I also reviewed the dosage of Megestrol she should take if her bleeding increases. Pt voiced understanding and thanked me for the call.

## 2020-01-20 ENCOUNTER — Other Ambulatory Visit: Payer: Self-pay

## 2020-01-20 ENCOUNTER — Encounter: Payer: Self-pay | Admitting: Obstetrics and Gynecology

## 2020-01-20 ENCOUNTER — Ambulatory Visit (INDEPENDENT_AMBULATORY_CARE_PROVIDER_SITE_OTHER): Payer: Self-pay | Admitting: Obstetrics and Gynecology

## 2020-01-20 VITALS — BP 134/107 | HR 103 | Wt 164.1 lb

## 2020-01-20 DIAGNOSIS — N939 Abnormal uterine and vaginal bleeding, unspecified: Secondary | ICD-10-CM

## 2020-01-20 NOTE — Progress Notes (Signed)
39 yo P0 returns for follow up on AUB. Patient reports improvement in vaginal bleeding with megace. She is however interested in the removal of intrauterine fibroid to help alleviate her heavy flow. Patient with without any other concerns  Past Medical History:  Diagnosis Date  . Abnormal Pap smear   . Anemia   . GERD (gastroesophageal reflux disease)     NO MEDS, CAN'T SWALLOW BIG PILLS  . Hypertension    Past Surgical History:  Procedure Laterality Date  . ANKLE SURGERY     Lt ankle fracture  . MYOMECTOMY N/A 01/09/2017   Procedure: ABDOMINAL MYOMECTOMY;  Surgeon: Mora Bellman, MD;  Location: [facility];  Service: Gynecology;  Laterality: N/A;   Family History  Problem Relation Age of Onset  . Diabetes Father   . Scoliosis Sister    Social History   Tobacco Use  . Smoking status: Never Smoker  . Smokeless tobacco: Never Used  Vaping Use  . Vaping Use: Never used  Substance Use Topics  . Alcohol use: No  . Drug use: No   ROS See pertinent in HPI. All other systems reviewed and negative  Blood pressure (!) 134/107, pulse (!) 103, weight 164 lb 1.6 oz (74.4 kg), last menstrual period 01/16/2020. GENERAL: Well-developed, well-nourished female in no acute distress.  NEURO: alert and oriented x 3  US PELVIC COMPLETE WITH TRANSVAGINAL  Result Date: 12/04/2019 CLINICAL DATA:  Uterine fibroids status post myomectomy, abnormal uterine bleeding EXAM: TRANSABDOMINAL AND TRANSVAGINAL ULTRASOUND OF PELVIS TECHNIQUE: Both transabdominal and transvaginal ultrasound examinations of the pelvis were performed. Transabdominal technique was performed for global imaging of the pelvis including uterus, ovaries, adnexal regions, and pelvic cul-de-sac. It was necessary to proceed with endovaginal exam following the transabdominal exam to visualize the endometrium. COMPARISON:  09/10/2017 FINDINGS: Uterus Measurements: 10.7 x 7.2 x 9.2 = volume: 367 mL. There are multiple heterogeneously hypoechoic  solid mass is noted within the fundus most in keeping with multiple uterine fibroids. The dominant lesion measures 4.1 x 4.2 x 2.9 cm within the left posterolateral fundus. A vascularized hypoechoic mass is seen best on image # 73 protruding endophytic Lea into the endometrial cavity likely representing a a intracavitary fibroid measuring 1.9 x 1.3 x 1.0 cm. The cervix is unremarkable. Endometrium Thickness: 11 mm.  No focal abnormality visualized. Right ovary Measurements: 3.5 x 1.8 x 3.0 cm = volume: 10 mL. Normal appearance/no adnexal mass. Left ovary Measurements: 3.2 x 0.9 x 0.8 cm = volume: 1 mL. Normal appearance/no adnexal mass. Other findings A small amount of simple appearing free fluid is seen within the cul-de-sac, possibly physiologic. IMPRESSION: 1. Fibroid uterus. Suspected 1.9 cm intracavitary fibroid. This could be confirmed with MRI examination if indicated. 2. Small amount of simple appearing free fluid within the cul-de-sac, possibly physiologic. 3. Otherwise unremarkable pelvic ultrasound. Electronically Signed   By: Fidela Salisbury MD   On: 12/04/2019 19:36    A/P 39 yo with submucosal fibroid and AUB - Discussed hysteroscopic myomectomy vs abdominal myomectomy- Patient desires hysteroscopic procedure - Patient is also considering hysterectomy - Patient will be scheduled for myosure procedure. Patient advised to continue megace for now

## 2020-03-18 ENCOUNTER — Telehealth: Payer: Self-pay | Admitting: Obstetrics and Gynecology

## 2020-03-18 NOTE — Telephone Encounter (Signed)
Patient has requested her procedure be postponed until February. Her insurance will start 04/03/2020, but she can wait until then if she has to.

## 2020-03-19 ENCOUNTER — Other Ambulatory Visit (HOSPITAL_COMMUNITY): Payer: Self-pay

## 2020-03-19 ENCOUNTER — Telehealth: Payer: Self-pay | Admitting: Obstetrics and Gynecology

## 2020-03-19 NOTE — Telephone Encounter (Signed)
Patient want her surgery to be rescheduled

## 2020-03-23 ENCOUNTER — Encounter (HOSPITAL_BASED_OUTPATIENT_CLINIC_OR_DEPARTMENT_OTHER): Admission: RE | Payer: Self-pay | Source: Home / Self Care

## 2020-03-23 ENCOUNTER — Ambulatory Visit (HOSPITAL_BASED_OUTPATIENT_CLINIC_OR_DEPARTMENT_OTHER): Admission: RE | Admit: 2020-03-23 | Payer: Self-pay | Source: Home / Self Care | Admitting: Obstetrics and Gynecology

## 2020-03-23 SURGERY — DILATATION & CURETTAGE/HYSTEROSCOPY WITH MYOSURE
Anesthesia: Choice

## 2020-04-05 ENCOUNTER — Telehealth: Payer: Self-pay | Admitting: Obstetrics and Gynecology

## 2020-04-05 NOTE — Telephone Encounter (Signed)
Pt states that she was suppose to have surgery and they cx due to insur but was told that she had to see Dr. Jolayne Ingram before she could have surg. Pt states she is not have any urgent issues just want to know when she can have surg. thanks

## 2020-04-06 NOTE — Telephone Encounter (Signed)
I called Katherine Ingram and she verified she had surgery cancelled due to insurance but now as of 04/03/2020 she has [employer] and wants to get surgery rescheduled. She also informed me she got a message notifying her of appointment/ consult with Dr. Jolayne Panther - per chart is with [facility]. She thanked me for calling her back. Lucette Kratz,RN

## 2020-04-15 ENCOUNTER — Encounter: Payer: Self-pay | Admitting: Obstetrics and Gynecology

## 2020-04-15 ENCOUNTER — Telehealth (INDEPENDENT_AMBULATORY_CARE_PROVIDER_SITE_OTHER): Payer: No Typology Code available for payment source | Admitting: Obstetrics and Gynecology

## 2020-04-15 ENCOUNTER — Other Ambulatory Visit: Payer: Self-pay

## 2020-04-15 DIAGNOSIS — D250 Submucous leiomyoma of uterus: Secondary | ICD-10-CM | POA: Diagnosis not present

## 2020-04-15 MED ORDER — MEGESTROL ACETATE 40 MG PO TABS: 40.0000 mg | ORAL_TABLET | Freq: Two times a day (BID) | ORAL | 5 refills | Status: DC

## 2020-04-15 NOTE — Progress Notes (Signed)
Virtual Consultation for Surgery.

## 2020-04-15 NOTE — Progress Notes (Signed)
    GYNECOLOGY VIRTUAL VISIT ENCOUNTER NOTE  Provider location: [facility]  [place]   I connected with Katherine Ingram on 04/15/20 at  2:15 PM EST by MyChart Video Encounter at home and verified that I am speaking with the correct person using two identifiers.   I discussed the limitations, risks, security and privacy concerns of performing an evaluation and management service virtually and the availability of in person appointments. I also discussed with the patient that there may be a patient responsible charge related to this service. The patient expressed understanding and agreed to proceed.   History:  Katherine Ingram is a 40 y.o. G0P0 female being evaluated today for surgical consultation. Patient with known submucosal fibroid and heavy regular periods previously scheduled for hysteroscopic resection but rescheduled her surgery time. She denies any abnormal vaginal discharge, bleeding, pelvic pain or other concerns.       Past Medical History:  Diagnosis Date  . Abnormal Pap smear   . Anemia   . GERD (gastroesophageal reflux disease)     NO MEDS, CAN'T SWALLOW BIG PILLS  . Hypertension    Past Surgical History:  Procedure Laterality Date  . ANKLE SURGERY     Lt ankle fracture  . MYOMECTOMY N/A 01/09/2017   Procedure: ABDOMINAL MYOMECTOMY;  Surgeon: Mora Bellman, MD;  Location: [facility];  Service: Gynecology;  Laterality: N/A;   The following portions of the patient's history were reviewed and updated as appropriate: allergies, current medications, past family history, past medical history, past social history, past surgical history and problem list.   Health Maintenance:  Normal pap and negative HRHPV on 12/01/19.    Review of Systems:  Pertinent items noted in HPI and remainder of comprehensive ROS otherwise negative.  Physical Exam:   General:  Alert, oriented and cooperative. Patient appears to be in no acute distress.  Mental Status: Normal mood and  affect. Normal behavior. Normal judgment and thought content.   Respiratory: Normal respiratory effort, no problems with respiration noted  Rest of physical exam deferred due to type of encounter  Labs and Imaging No results found for this or any previous visit (from the past 336 hour(s)). No results found.     Assessment and Plan:     1. Submucous leiomyoma of uterus Patient remains interested in hysteroscopic myomectomy. Risks, benefits and alternatives were explained including but not limited to risks of bleeding, infection and damage to adjacent organs. Patient verbalized understanding Refill on Megace provided       I discussed the assessment and treatment plan with the patient. The patient was provided an opportunity to ask questions and all were answered. The patient agreed with the plan and demonstrated an understanding of the instructions.   The patient was advised to call back or seek an in-person evaluation/go to the ED if the symptoms worsen or if the condition fails to improve as anticipated.  I provided 15 minutes of face-to-face time during this encounter.   Mora Bellman, MD [facility]

## 2020-04-26 ENCOUNTER — Telehealth: Payer: Self-pay | Admitting: *Deleted

## 2020-04-26 NOTE — Telephone Encounter (Signed)
error 

## 2020-04-29 ENCOUNTER — Other Ambulatory Visit: Payer: Self-pay | Admitting: Obstetrics and Gynecology

## 2020-05-26 ENCOUNTER — Other Ambulatory Visit: Payer: Self-pay

## 2020-05-26 ENCOUNTER — Encounter (HOSPITAL_BASED_OUTPATIENT_CLINIC_OR_DEPARTMENT_OTHER): Payer: Self-pay | Admitting: Obstetrics and Gynecology

## 2020-05-26 NOTE — Progress Notes (Signed)
Spoke w/ via phone for pre-op interview---pt Lab needs dos----   Urine poct            Lab results------none COVID test ------05-29-2020 1100 Arrive at -------800 am 06-01-2020 NPO after MN NO Solid Food.  Clear liquids from MN until---700 am then npo Medications to take morning of surgery -----xanax prn, certrizine Diabetic medication -----n/a Patient Special Instructions -----none Pre-Op special Istructions -----none Patient verbalized understanding of instructions that were given at this phone interview. Patient denies shortness of breath, chest pain, fever, cough at this phone interview.

## 2020-05-29 ENCOUNTER — Other Ambulatory Visit (HOSPITAL_COMMUNITY)
Admission: RE | Admit: 2020-05-29 | Discharge: 2020-05-29 | Disposition: A | Discharge status: Home / Self Care | Payer: No Typology Code available for payment source | Source: Ambulatory Visit | Attending: Obstetrics and Gynecology | Admitting: Obstetrics and Gynecology

## 2020-05-29 DIAGNOSIS — Z01812 Encounter for preprocedural laboratory examination: Secondary | ICD-10-CM | POA: Diagnosis present

## 2020-05-29 DIAGNOSIS — Z20822 Contact with and (suspected) exposure to covid-19: Secondary | ICD-10-CM | POA: Insufficient documentation

## 2020-05-29 LAB — SARS CORONAVIRUS 2 (TAT 6-24 HRS): SARS Coronavirus 2: NEGATIVE

## 2020-05-31 NOTE — Progress Notes (Signed)
Pt called and informed of arrival time change.  Pt now to arrive at 0640 in am.  Pt aware, verbalized her understanding.Marland Kitchen

## 2020-05-31 NOTE — H&P (Signed)
Katherine Ingram is an 40 y.o. female P0 with small submucosal fibroid and heavy regular period presenting today for scheduled hysteroscopic resections. Patient is otherwise without complaints  Pertinent Gynecological History: Menses: regular every month without intermenstrual spotting Contraception: none DES exposure: denies Blood transfusions: none Previous GYN Procedures: myomectomy  Last pap: normal Date: 12/01/19 OB History: G0, P0   Menstrual History: Patient's last menstrual period was 05/04/2020.    Past Medical History:  Diagnosis Date  . Abnormal Pap smear   . Anemia   . Fibroid   . GERD (gastroesophageal reflux disease)     NO MEDS, CAN'T SWALLOW BIG PILLS  . History of blood transfusion 2018  . History of hypertension    off medication since 2019  . Wears glasses     Past Surgical History:  Procedure Laterality Date  . ANKLE SURGERY  yrs ago   Lt ankle fracture screws placed  . MYOMECTOMY N/A 01/09/2017   Procedure: ABDOMINAL MYOMECTOMY;  Surgeon: Mora Bellman, MD;  Location: [facility];  Service: Gynecology;  Laterality: N/A;    Family History  Problem Relation Age of Onset  . Diabetes Father   . Scoliosis Sister     Social History:  reports that she has never smoked. She has never used smokeless tobacco. She reports that she does not drink alcohol and does not use drugs.  Allergies:  Allergies  Allergen Reactions  . Penicillins Rash    Medications Prior to Admission  Medication Sig Dispense Refill Last Dose  . ALPRAZolam (XANAX) 0.25 MG tablet Take 0.25 mg by mouth daily as needed.   05/31/2020 at Unknown time  . cetirizine (ZYRTEC) 10 MG tablet Take 10 mg by mouth daily.   Past Month at Unknown time  . ferrous sulfate 324 MG TBEC Take 324 mg by mouth daily with breakfast.   05/31/2020 at Unknown time  . megestrol (MEGACE) 40 MG tablet Take 1 tablet (40 mg total) by mouth 2 (two) times daily. Can increase to two tablets twice a day in the event of heavy  bleeding 60 tablet 5 Past Week at Unknown time  . zolpidem (AMBIEN) 5 MG tablet Take 5 mg by mouth at bedtime as needed.   Past Week at Unknown time  . ibuprofen (ADVIL,MOTRIN) 600 MG tablet Take 1 tablet (600 mg total) by mouth every 6 (six) hours as needed for fever or headache. (Patient not taking: No sig reported) 30 tablet 3 More than a month at Unknown time  . Multiple Vitamin (MULTIVITAMIN WITH MINERALS) TABS tablet Take 3 tablets by mouth daily.   More than a month at Unknown time    Review of Systems See above in HPI. All other systems reviewed and non contributory Blood pressure (!) 147/98, pulse (!) 117, temperature 98 F (36.7 C), temperature source Oral, resp. rate 16, height 5\' 5"  (1.651 m), weight 78 kg, last menstrual period 05/04/2020, SpO2 100 %. Physical Exam GENERAL: Well-developed, well-nourished female in no acute distress.  LUNGS: Clear to auscultation bilaterally.  HEART: Regular rate and rhythm. ABDOMEN: Soft, nontender, nondistended. No organomegaly. PELVIC: deferred to OR EXTREMITIES: No cyanosis, clubbing, or edema, 2+ distal pulses. Results for orders placed or performed during the hospital encounter of 06/01/20 (from the past 24 hour(s))  Pregnancy, urine POC     Status: None   Collection Time: 06/01/20  6:55 AM  Result Value Ref Range   Preg Test, Ur NEGATIVE NEGATIVE    No results found.  Assessment/Plan: 40 yo with  heavy menses and submucosal fibroid here for hysteroscopic resection - Risks, benefits and alternatives were reviewed with the patient including but not limited to risks of bleeding, infection, uterine perforation and damage to adjacent organs. Patient verbalized understanding and all questions were answered  Katherine Ingram 06/01/2020, 7:42 AM

## 2020-06-01 ENCOUNTER — Other Ambulatory Visit: Payer: Self-pay

## 2020-06-01 ENCOUNTER — Encounter (HOSPITAL_BASED_OUTPATIENT_CLINIC_OR_DEPARTMENT_OTHER): Payer: Self-pay | Admitting: Obstetrics and Gynecology

## 2020-06-01 ENCOUNTER — Encounter (HOSPITAL_BASED_OUTPATIENT_CLINIC_OR_DEPARTMENT_OTHER)
Admission: RE | Disposition: A | Discharge status: Home / Self Care | Payer: Self-pay | Source: Home / Self Care | Attending: Obstetrics and Gynecology

## 2020-06-01 ENCOUNTER — Ambulatory Visit (HOSPITAL_BASED_OUTPATIENT_CLINIC_OR_DEPARTMENT_OTHER): Payer: No Typology Code available for payment source | Admitting: Certified Registered"

## 2020-06-01 ENCOUNTER — Ambulatory Visit (HOSPITAL_BASED_OUTPATIENT_CLINIC_OR_DEPARTMENT_OTHER)
Admission: RE | Admit: 2020-06-01 | Discharge: 2020-06-01 | Disposition: A | Discharge status: Home / Self Care | Payer: No Typology Code available for payment source | Attending: Obstetrics and Gynecology | Admitting: Obstetrics and Gynecology

## 2020-06-01 DIAGNOSIS — Z880 Allergy status to penicillin: Secondary | ICD-10-CM | POA: Diagnosis not present

## 2020-06-01 DIAGNOSIS — N939 Abnormal uterine and vaginal bleeding, unspecified: Secondary | ICD-10-CM | POA: Insufficient documentation

## 2020-06-01 DIAGNOSIS — Z79899 Other long term (current) drug therapy: Secondary | ICD-10-CM | POA: Insufficient documentation

## 2020-06-01 DIAGNOSIS — D259 Leiomyoma of uterus, unspecified: Secondary | ICD-10-CM

## 2020-06-01 DIAGNOSIS — N840 Polyp of corpus uteri: Secondary | ICD-10-CM | POA: Diagnosis not present

## 2020-06-01 DIAGNOSIS — D250 Submucous leiomyoma of uterus: Secondary | ICD-10-CM

## 2020-06-01 HISTORY — DX: Benign neoplasm of connective and other soft tissue, unspecified: D21.9

## 2020-06-01 HISTORY — DX: Presence of spectacles and contact lenses: Z97.3

## 2020-06-01 HISTORY — PX: DILATATION & CURETTAGE/HYSTEROSCOPY WITH MYOSURE: SHX6511

## 2020-06-01 HISTORY — DX: Personal history of other diseases of the circulatory system: Z86.79

## 2020-06-01 LAB — CBC
HCT: 42.4 % (ref 36.0–46.0)
Hemoglobin: 13.5 g/dL (ref 12.0–15.0)
MCH: 26.1 pg (ref 26.0–34.0)
MCHC: 31.8 g/dL (ref 30.0–36.0)
MCV: 82 fL (ref 80.0–100.0)
Platelets: 356 10*3/uL (ref 150–400)
RBC: 5.17 MIL/uL — ABNORMAL HIGH (ref 3.87–5.11)
RDW: 18.4 % — ABNORMAL HIGH (ref 11.5–15.5)
WBC: 10.1 10*3/uL (ref 4.0–10.5)
nRBC: 0 % (ref 0.0–0.2)

## 2020-06-01 LAB — TYPE AND SCREEN
ABO/RH(D): O POS
Antibody Screen: NEGATIVE

## 2020-06-01 LAB — POCT PREGNANCY, URINE: Preg Test, Ur: NEGATIVE

## 2020-06-01 SURGERY — DILATATION & CURETTAGE/HYSTEROSCOPY WITH MYOSURE
Anesthesia: General | Site: Vagina

## 2020-06-01 MED ORDER — KETOROLAC TROMETHAMINE 30 MG/ML IJ SOLN
INTRAMUSCULAR | Status: AC
  Filled 2020-06-01: qty 1

## 2020-06-01 MED ORDER — OXYCODONE-ACETAMINOPHEN 5-325 MG PO TABS: 1.0000 | ORAL_TABLET | Freq: Four times a day (QID) | ORAL | 0 refills | Status: DC | PRN

## 2020-06-01 MED ORDER — DEXAMETHASONE SODIUM PHOSPHATE 10 MG/ML IJ SOLN
INTRAMUSCULAR | Status: AC
  Filled 2020-06-01: qty 1

## 2020-06-01 MED ORDER — LIDOCAINE HCL (PF) 1 % IJ SOLN
INTRAMUSCULAR | Status: DC | PRN
  Administered 2020-06-01: 10 mL

## 2020-06-01 MED ORDER — MIDAZOLAM HCL 5 MG/5ML IJ SOLN
INTRAMUSCULAR | Status: DC | PRN
  Administered 2020-06-01: 2 mg via INTRAVENOUS

## 2020-06-01 MED ORDER — PROPOFOL 10 MG/ML IV BOLUS
INTRAVENOUS | Status: AC
  Filled 2020-06-01: qty 20

## 2020-06-01 MED ORDER — ACETAMINOPHEN 500 MG PO TABS
1000.0000 mg | ORAL_TABLET | Freq: Once | ORAL | Status: AC
  Administered 2020-06-01: 1000 mg via ORAL

## 2020-06-01 MED ORDER — SCOPOLAMINE 1 MG/3DAYS TD PT72
MEDICATED_PATCH | TRANSDERMAL | Status: AC
  Filled 2020-06-01: qty 1

## 2020-06-01 MED ORDER — FENTANYL CITRATE (PF) 100 MCG/2ML IJ SOLN
INTRAMUSCULAR | Status: DC | PRN
  Administered 2020-06-01 (×3): 50 ug via INTRAVENOUS

## 2020-06-01 MED ORDER — LIDOCAINE 2% (20 MG/ML) 5 ML SYRINGE
INTRAMUSCULAR | Status: DC | PRN
  Administered 2020-06-01: 30 mg via INTRAVENOUS

## 2020-06-01 MED ORDER — LACTATED RINGERS IV SOLN: INTRAVENOUS | Status: DC

## 2020-06-01 MED ORDER — ONDANSETRON HCL 4 MG/2ML IJ SOLN
INTRAMUSCULAR | Status: AC
  Filled 2020-06-01: qty 4

## 2020-06-01 MED ORDER — MIDAZOLAM HCL 2 MG/2ML IJ SOLN
INTRAMUSCULAR | Status: AC
  Filled 2020-06-01: qty 2

## 2020-06-01 MED ORDER — PROPOFOL 10 MG/ML IV BOLUS
INTRAVENOUS | Status: DC | PRN
  Administered 2020-06-01: 200 mg via INTRAVENOUS

## 2020-06-01 MED ORDER — ONDANSETRON HCL 4 MG/2ML IJ SOLN
INTRAMUSCULAR | Status: DC | PRN
  Administered 2020-06-01 (×2): 4 mg via INTRAVENOUS

## 2020-06-01 MED ORDER — DEXMEDETOMIDINE (PRECEDEX) IN NS 20 MCG/5ML (4 MCG/ML) IV SYRINGE
PREFILLED_SYRINGE | INTRAVENOUS | Status: AC
  Filled 2020-06-01: qty 10

## 2020-06-01 MED ORDER — SCOPOLAMINE 1 MG/3DAYS TD PT72
1.0000 | MEDICATED_PATCH | TRANSDERMAL | Status: DC
  Administered 2020-06-01: 1.5 mg via TRANSDERMAL

## 2020-06-01 MED ORDER — DEXMEDETOMIDINE (PRECEDEX) IN NS 20 MCG/5ML (4 MCG/ML) IV SYRINGE
PREFILLED_SYRINGE | INTRAVENOUS | Status: DC | PRN
  Administered 2020-06-01: 8 ug via INTRAVENOUS
  Administered 2020-06-01: 4 ug via INTRAVENOUS

## 2020-06-01 MED ORDER — DEXAMETHASONE SODIUM PHOSPHATE 10 MG/ML IJ SOLN
INTRAMUSCULAR | Status: DC | PRN
  Administered 2020-06-01: 10 mg via INTRAVENOUS

## 2020-06-01 MED ORDER — FENTANYL CITRATE (PF) 100 MCG/2ML IJ SOLN
INTRAMUSCULAR | Status: AC
  Filled 2020-06-01: qty 2

## 2020-06-01 MED ORDER — SODIUM CHLORIDE 0.9 % IR SOLN
Status: DC | PRN
  Administered 2020-06-01 (×2): 3000 mL

## 2020-06-01 MED ORDER — ACETAMINOPHEN 500 MG PO TABS
ORAL_TABLET | ORAL | Status: AC
  Filled 2020-06-01: qty 2

## 2020-06-01 MED ORDER — LIDOCAINE HCL (PF) 2 % IJ SOLN
INTRAMUSCULAR | Status: AC
  Filled 2020-06-01: qty 5

## 2020-06-01 MED ORDER — KETOROLAC TROMETHAMINE 30 MG/ML IJ SOLN
INTRAMUSCULAR | Status: DC | PRN
  Administered 2020-06-01: 30 mg via INTRAVENOUS

## 2020-06-01 SURGICAL SUPPLY — 20 items
BIPOLAR CUTTING LOOP 21FR (ELECTRODE)
CANISTER SUCT 3000ML PPV (MISCELLANEOUS) ×2 IMPLANT
CATH ROBINSON RED A/P 16FR (CATHETERS) IMPLANT
COVER WAND RF STERILE (DRAPES) ×2 IMPLANT
DEVICE MYOSURE REACH (MISCELLANEOUS) ×2 IMPLANT
DILATOR CANAL MILEX (MISCELLANEOUS) IMPLANT
ELECT REM PT RETURN 9FT ADLT (ELECTROSURGICAL)
ELECTRODE REM PT RTRN 9FT ADLT (ELECTROSURGICAL) IMPLANT
GLOVE SURG POLYISO LF SZ6.5 (GLOVE) ×2 IMPLANT
GLOVE SURG POLYISO LF SZ7 (GLOVE) ×2 IMPLANT
GOWN STRL REUS W/TWL LRG LVL3 (GOWN DISPOSABLE) ×2 IMPLANT
KIT PROCEDURE FLUENT (KITS) ×2 IMPLANT
KIT TURNOVER CYSTO (KITS) ×2 IMPLANT
LOOP CUTTING BIPOLAR 21FR (ELECTRODE) IMPLANT
PACK VAGINAL MINOR WOMEN LF (CUSTOM PROCEDURE TRAY) ×2 IMPLANT
PAD OB MATERNITY 4.3X12.25 (PERSONAL CARE ITEMS) ×2 IMPLANT
PAD PREP 24X48 CUFFED NSTRL (MISCELLANEOUS) ×2 IMPLANT
SEAL ROD LENS SCOPE MYOSURE (ABLATOR) ×2 IMPLANT
TOWEL OR 17X26 10 PK STRL BLUE (TOWEL DISPOSABLE) ×2 IMPLANT
WATER STERILE IRR 500ML POUR (IV SOLUTION) ×2 IMPLANT

## 2020-06-01 NOTE — Op Note (Signed)
PREOPERATIVE DIAGNOSIS:  Abnormal uterine bleeding. POSTOPERATIVE DIAGNOSIS: The same PROCEDURE: Hysteroscopy, Dilation and Curettage, myosure SURGEON:  Dr. Mora Bellman   INDICATIONS: 40 y.o. G0P0  here for scheduled surgery for hysteroscopic fibroid resection.   Risks of surgery were discussed with the patient including but not limited to: bleeding which may require transfusion; infection which may require antibiotics; injury to uterus or surrounding organs; intrauterine scarring which may impair future fertility; need for additional procedures including laparotomy or laparoscopy; and other postoperative/anesthesia complications. Written informed consent was obtained.    FINDINGS:  A 9 week size uterus.  Diffuse proliferative endometrium.  Normal ostia bilaterally. 2 cm fundal fibroid visualized near fundal region. 0.5 cm cervical polyp  ANESTHESIA:   General, paracervical block. INTRAVENOUS FLUIDS:  600 ml of LR FLUID DEFICITS:  1900 ml of normal saline ESTIMATED BLOOD LOSS:  Less than 20 ml SPECIMENS: cervical polyp and fragments of fibroid sent to pathology COMPLICATIONS:  None immediate.  PROCEDURE DETAILS:  The patient received intravenous antibiotics while in the preoperative area.  She was then taken to the operating room where general anesthesia was administered and was found to be adequate.  After an adequate timeout was performed, she was placed in the dorsal lithotomy position and examined; then prepped and draped in the sterile manner.   Her bladder was catheterized for an unmeasured amount of clear, yellow urine. A speculum was then placed in the patient's vagina and a single tooth tenaculum was applied to the anterior lip of the cervix.   A paracervical block using 10 ml of 0.5% Marcaine was administered.  The cervix was sounded to 9 cm and dilated manually with Hagar dilators to accommodate the 5 mm diagnostic hysteroscope.  Once the cervix was dilated, the hysteroscope was  inserted under direct visualization using normal saline as a suspension medium.  The uterine cavity was carefully examined, both ostia were recognized, and diffusely proliferative endometrium was noted along with the above findings. Using the [facility] device, the fibroid was carefully removed.  After further careful visualization of the uterine cavity, the hysteroscope was removed under direct visualization.  The tenaculum was removed from the anterior lip of the cervix and the vaginal speculum was removed after noting good hemostasis.  The patient tolerated the procedure well and was taken to the recovery area awake, extubated and in stable condition.

## 2020-06-01 NOTE — Anesthesia Preprocedure Evaluation (Addendum)
Anesthesia Evaluation  Patient identified by MRN, date of birth, ID band Patient awake    Reviewed: Allergy & Precautions, NPO status , Patient's Chart, lab work & pertinent test results  History of Anesthesia Complications Negative for: history of anesthetic complications  Airway Mallampati: I  TM Distance: >3 FB Neck ROM: Full    Dental  (+) Dental Advisory Given   Pulmonary neg pulmonary ROS,  05/29/2020 SARS coronavirus NEG   breath sounds clear to auscultation       Cardiovascular negative cardio ROS   Rhythm:Regular Rate:Normal     Neuro/Psych negative neurological ROS     GI/Hepatic Neg liver ROS, GERD  Controlled,  Endo/Other  negative endocrine ROS  Renal/GU negative Renal ROS     Musculoskeletal   Abdominal   Peds  Hematology negative hematology ROS (+)   Anesthesia Other Findings   Reproductive/Obstetrics                            Anesthesia Physical Anesthesia Plan  ASA: I  Anesthesia Plan: General   Post-op Pain Management:    Induction: Intravenous  PONV Risk Score and Plan: 3 and Ondansetron, Dexamethasone and Scopolamine patch - Pre-op  Airway Management Planned: LMA  Additional Equipment: None  Intra-op Plan:   Post-operative Plan:   Informed Consent: I have reviewed the patients History and Physical, chart, labs and discussed the procedure including the risks, benefits and alternatives for the proposed anesthesia with the patient or authorized representative who has indicated his/her understanding and acceptance.     Dental advisory given  Plan Discussed with: CRNA and Surgeon  Anesthesia Plan Comments:        Anesthesia Quick Evaluation

## 2020-06-01 NOTE — Anesthesia Procedure Notes (Signed)
Procedure Name: LMA Insertion Date/Time: 06/01/2020 9:04 AM Performed by: Gwyndolyn Saxon, CRNA Pre-anesthesia Checklist: Patient identified, Emergency Drugs available, Suction available and Patient being monitored Patient Re-evaluated:Patient Re-evaluated prior to induction Oxygen Delivery Method: Circle System Utilized Preoxygenation: Pre-oxygenation with 100% oxygen Induction Type: IV induction Ventilation: Mask ventilation without difficulty LMA: LMA inserted LMA Size: 4.0 Number of attempts: 1 Airway Equipment and Method: Bite block Placement Confirmation: positive ETCO2 Tube secured with: Tape Dental Injury: Teeth and Oropharynx as per pre-operative assessment

## 2020-06-01 NOTE — Discharge Instructions (Signed)
DISCHARGE INSTRUCTIONS: D&C / D&E The following instructions have been prepared to help you care for yourself upon your return home.   Personal hygiene: Marland Kitchen Use sanitary pads for vaginal drainage, not tampons. . Shower the day after your procedure. . NO tub baths, pools or Jacuzzis for 2-3 weeks. . Wipe front to back after using the bathroom.  Activity and limitations: . Do NOT drive or operate any equipment for 24 hours. The effects of anesthesia are still present and drowsiness may result. . Do NOT rest in bed all day. . Walking is encouraged. . Walk up and down stairs slowly. . You may resume your normal activity in one to two days or as indicated by your physician.  Sexual activity: NO intercourse for at least 2 weeks after the procedure, or as indicated by your physician.  Diet: Eat a light meal as desired this evening. You may resume your usual diet tomorrow.  Return to work: You may resume your work activities in one to two days or as indicated by your doctor.  What to expect after your surgery: Expect to have vaginal bleeding/discharge for 2-3 days and spotting for up to 10 days. It is not unusual to have soreness for up to 1-2 weeks. You may have a slight burning sensation when you urinate for the first day. Mild cramps may continue for a couple of days. You may have a regular period in 2-6 weeks.  Call your doctor for any of the following: . Excessive vaginal bleeding, saturating and changing one pad every hour. . Inability to urinate 6 hours after discharge from hospital. . Pain not relieved by pain medication. . Fever of 100.4 F or greater. . Unusual vaginal discharge or odor.   Call for an appointment:     Atlas of pelvic anatomy and gynecologic surgery (4th ed., pp. 205-212). [place]: [employer].">  Dilation and Curettage or Vacuum Curettage Dilation and curettage (D&C) and vacuum curettage are minor procedures. A D&C involves stretching the cervix (dilation)  and scraping the inside lining of the uterus with surgical instruments (curettage). During a D&C, tissue is gently scraped from the lining of the uterus (endometrium), starting from the top portion of the uterus down to the lowest part of the uterus. During a vacuum curettage, the lining and tissue in the uterus are removed with the use of gentle suction. Curettage may be performed to either diagnose or treat a problem. For diagnosis A diagnostic curettage may be done if you have:  Irregular bleeding in the uterus.  Bleeding with the development of clots.  Spotting between menstrual periods.  Prolonged menstrual periods or other abnormal bleeding.  Bleeding after menopause.  No menstrual period (amenorrhea).  A change in size and shape of the uterus.  Abnormal endometrial cells discovered during a Pap test. For treatment Curettage may be done:  To remove an IUD (intrauterine device).  To remove remaining placenta after giving birth.  During an abortion.  During a miscarriage.  To remove growths in the lining of the uterus.  To remove some rare types of non-cancerous lumps (fibroids). Tell a health care provider about:  Any allergies you have, including allergies to prescribed medicine or latex.  All medicines you are taking, including vitamins, herbs, eye drops, creams, and over-the-counter medicines.  Any blood-thinning medicine you may be taking.  Any problems you or family members have had with anesthetic medicines.  Any blood disorders you have.  Any surgeries you have had.  Your medical history  and any medical conditions you have.  Whether you are pregnant or may be pregnant.  Recent vaginal infections you have had.  Recent menstrual periods, bleeding problems you have had, and what form of birth control (contraception) you use. What are the risks? Generally, this is a safe procedure. However, problems may occur, including:  Infection.  Heavy vaginal  bleeding.  Allergic reactions to medicines.  Damage to the cervix or other structures or organs.  Development of scar tissue (adhesions) inside the uterus. This can cause abnormal periods and may make it harder to get pregnant.  A hole (perforation) in the wall of the uterus. This is rare. What happens before the procedure? Staying hydrated Follow instructions from your health care provider about hydration, which may include:  Up to 2 hours before the procedure - you may continue to drink clear liquids, such as water, clear fruit juice, black coffee, and plain tea.   Eating and drinking restrictions Follow instructions from your health care provider about eating and drinking, which may include:  8 hours before the procedure - stop eating heavy meals or foods, such as meat, fried foods, or fatty foods.  6 hours before the procedure - stop eating light meals or foods, such as toast or cereal.  6 hours before the procedure - stop drinking milk or drinks that contain milk.  2 hours before the procedure - stop drinking clear liquids. If your health care provider told you to take your medicine(s) on the day of your procedure, take them with only a sip of water. Medicines  Ask your health care provider about: ? Changing or stopping your regular medicines. This is especially important if you are taking diabetes medicines or blood thinners. ? Taking medicines such as aspirin and ibuprofen. These medicines can thin your blood. Do not take these medicines unless your health care provider tells you to take them. ? Taking over-the-counter medicines, vitamins, herbs, and supplements.  You may be given a medicine to soften the cervix in order to help with dilation. Surgery safety Ask your health care provider what steps will be taken to help prevent infection. These may include:  Removing hair at the surgery site.  Washing skin with a germ-killing soap.  Taking antibiotic medicine. General  instructions  Do not use any products that contain nicotine or tobacco for at least 4 weeks before the procedure. These products include cigarettes, e-cigarettes, and chewing tobacco. If you need help quitting, ask your health care provider.  For 24 hours before your procedure, do not: ? Douche. ? Use tampons. ? Use medicines, creams, or suppositories in the vagina. ? Have sex.  You may be given a pregnancy test on the day of the procedure.  You may have a blood or urine sample taken.  Plan to have someone take you home from the hospital or clinic.  If you will be going home right after the procedure, plan to have someone with you for 24 hours. What happens during the procedure?  An IV will be inserted into one of your veins.  You will be given one of the following: ? A medicine that numbs the area in and around the cervix (local anesthetic). ? A medicine to make you fall asleep (general anesthetic).  You will lie down on your back, with your feet in foot rests (stirrups).  The size and position of your uterus will be checked.  A lubricated instrument (speculum or Sims retractor) will be inserted into the back side  of your vagina. The speculum will be used to hold apart the walls of your vagina so your health care provider can see your cervix.  A tool (tenaculum) will be attached to the lip of the cervix to stabilize it.  Your cervix will be softened and dilated. This may be done by: ? Taking medicine, either orally or vaginally. ? Having thin rods (laminaria) or gradual widening instruments (tapered dilators) inserted into your cervix.  A small, sharp, curved instrument (curette) will be used to scrape a small amount of tissue or cells from the endometrium or cervical canal. In some cases, gentle suction is applied with the curette.  The curette will then be removed.  The cells will be taken to a lab for testing. The procedure may vary among health care providers and  hospitals.   What happens after the procedure?  Your blood pressure, heart rate, breathing rate, and blood oxygen level will be monitored until you leave the hospital or clinic.  You may have mild cramping, backache, pain, and light bleeding or spotting. You may pass small blood clots from your vagina.  You may have to wear compression stockings. These stockings help to prevent blood clots and reduce swelling in your legs. Summary  Dilation and curettage (D&C) involves stretching (dilating) the cervix and scraping the inside lining of the uterus (curettage).  Follow your health care provider's instructions about when to stop eating and drinking, and whether to stop or change any medicines.  After the procedure, you may have mild cramping, backache, pain, and light bleeding or spotting. You may pass small blood clots from your vagina.  Plan to have someone take you home from the hospital or clinic.  Post Anesthesia Home Care Instructions  Activity: Get plenty of rest for the remainder of the day. A responsible individual must stay with you for 24 hours following the procedure.  For the next 24 hours, DO NOT: -Drive a car -Paediatric nurse -Drink alcoholic beverages -Take any medication unless instructed by your physician -Make any legal decisions or sign important papers.  Meals: Start with liquid foods such as gelatin or soup. Progress to regular foods as tolerated. Avoid greasy, spicy, heavy foods. If nausea and/or vomiting occur, drink only clear liquids until the nausea and/or vomiting subsides. Call your physician if vomiting continues.  Special Instructions/Symptoms: Your throat may feel dry or sore from the anesthesia or the breathing tube placed in your throat during surgery. If this causes discomfort, gargle with warm salt water. The discomfort should disappear within 24 hours.  If you had a scopolamine patch placed behind your ear for the management of post- operative  nausea and/or vomiting:  1. The medication in the patch is effective for 72 hours, after which it should be removed.  Wrap patch in a tissue and discard in the trash. Wash hands thoroughly with soap and water. 2. You may remove the patch earlier than 72 hours if you experience unpleasant side effects which may include dry mouth, dizziness or visual disturbances. 3. Avoid touching the patch. Wash your hands with soap and water after contact with the patch.  4. Please remove by Friday March 06/04/20

## 2020-06-01 NOTE — Anesthesia Postprocedure Evaluation (Signed)
Anesthesia Post Note  Patient: Katherine Ingram  Procedure(s) Performed: DILATATION & CURETTAGE/HYSTEROSCOPY WITH MYOSURE (N/A Vagina )     Patient location during evaluation: Phase II Anesthesia Type: General Level of consciousness: awake and alert, patient cooperative and oriented Pain management: pain level controlled Vital Signs Assessment: post-procedure vital signs reviewed and stable Respiratory status: spontaneous breathing, nonlabored ventilation and respiratory function stable Cardiovascular status: blood pressure returned to baseline and stable Postop Assessment: no apparent nausea or vomiting, able to ambulate and adequate PO intake Anesthetic complications: no   No complications documented.  Last Vitals:  Vitals:   06/01/20 1045 06/01/20 1117  BP: (!) 135/99 (!) 145/94  Pulse: (!) 103 92  Resp: (!) 22 20  Temp: (!) 36.4 C   SpO2: 100% 100%    Last Pain:  Vitals:   06/01/20 1045  TempSrc:   PainSc: 4                  Layne Dilauro,E. Bj Morlock

## 2020-06-01 NOTE — Transfer of Care (Signed)
Immediate Anesthesia Transfer of Care Note  Patient: [name]  Procedure(s) Performed: DILATATION & CURETTAGE/HYSTEROSCOPY WITH MYOSURE (N/A Vagina )  Patient Location: PACU  Anesthesia Type:General  Level of Consciousness: drowsy and patient cooperative  Airway & Oxygen Therapy: Patient Spontanous Breathing and Patient connected to nasal cannula oxygen  Post-op Assessment: Report given to RN and Post -op Vital signs reviewed and stable  Post vital signs: Reviewed and stable  Last Vitals:  Vitals Value Taken Time  BP 138/93 06/01/20 0951  Temp 36.5 C 06/01/20 0951  Pulse 118 06/01/20 0956  Resp 25 06/01/20 0956  SpO2 98 % 06/01/20 0956  Vitals shown include unvalidated device data.  Last Pain:  Vitals:   06/01/20 1962  TempSrc: Oral  PainSc: 0-No pain      Patients Stated Pain Goal: 5 (22/97/98 9211)  Complications: No complications documented.

## 2020-06-02 ENCOUNTER — Encounter (HOSPITAL_BASED_OUTPATIENT_CLINIC_OR_DEPARTMENT_OTHER): Payer: Self-pay | Admitting: Obstetrics and Gynecology

## 2020-06-02 LAB — SURGICAL PATHOLOGY

## 2020-06-17 ENCOUNTER — Telehealth (INDEPENDENT_AMBULATORY_CARE_PROVIDER_SITE_OTHER): Payer: No Typology Code available for payment source | Admitting: Obstetrics and Gynecology

## 2020-06-17 DIAGNOSIS — Z48816 Encounter for surgical aftercare following surgery on the genitourinary system: Secondary | ICD-10-CM

## 2020-06-17 DIAGNOSIS — Z9889 Other specified postprocedural states: Secondary | ICD-10-CM

## 2020-06-17 NOTE — Progress Notes (Signed)
    GYNECOLOGY VIRTUAL VISIT ENCOUNTER NOTE  Provider location: [facility] [place]   I connected with Katherine Ingram on 06/17/20 at 11:15 AM EDT by MyChart Video Encounter at home and verified that I am speaking with the correct person using two identifiers.   I discussed the limitations, risks, security and privacy concerns of performing an evaluation and management service virtually and the availability of in person appointments. I also discussed with the patient that there may be a patient responsible charge related to this service. The patient expressed understanding and agreed to proceed.   History:  Katherine Ingram is a 40 y.o. G0P0 female being evaluated today for post op check. Patient underwent the hysteroscopic resection of a submucous fibroid on 3/1. She reports doing well since her procedure. She denies any abnormal vaginal discharge, bleeding, pelvic pain or other concerns.       Past Medical History:  Diagnosis Date  . Abnormal Pap smear   . Anemia   . Fibroid   . GERD (gastroesophageal reflux disease)     NO MEDS, CAN'T SWALLOW BIG PILLS  . History of blood transfusion 2018  . History of hypertension    off medication since 2019  . Wears glasses    Past Surgical History:  Procedure Laterality Date  . ANKLE SURGERY  yrs ago   Lt ankle fracture screws placed  . DILATATION & CURETTAGE/HYSTEROSCOPY WITH MYOSURE N/A 06/01/2020   Procedure: DILATATION & CURETTAGE/HYSTEROSCOPY WITH MYOSURE;  Surgeon: Mora Bellman, MD;  Location: [place];  Service: Gynecology;  Laterality: N/A;  . MYOMECTOMY N/A 01/09/2017   Procedure: ABDOMINAL MYOMECTOMY;  Surgeon: Mora Bellman, MD;  Location: [facility];  Service: Gynecology;  Laterality: N/A;   The following portions of the patient's history were reviewed and updated as appropriate: allergies, current medications, past family history, past medical history, past social history, past surgical history and  problem list.   Health Maintenance:  Normal pap and negative HRHPV on 11/2019.   Review of Systems:  Pertinent items noted in HPI and remainder of comprehensive ROS otherwise negative.  Physical Exam:   General:  Alert, oriented and cooperative. Patient appears to be in no acute distress.  Mental Status: Normal mood and affect. Normal behavior. Normal judgment and thought content.   Respiratory: Normal respiratory effort, no problems with respiration noted  Rest of physical exam deferred due to type of encounter  Labs and Imaging No results found for this or any previous visit (from the past 336 hour(s)). No results found.     Assessment and Plan:     1. Post-operative state Patient is medically cleared to resume all activities of daily living Patient to keep a menstrual calendar RTC in a year for physical exam with screening mammogram       I discussed the assessment and treatment plan with the patient. The patient was provided an opportunity to ask questions and all were answered. The patient agreed with the plan and demonstrated an understanding of the instructions.   The patient was advised to call back or seek an in-person evaluation/go to the ED if the symptoms worsen or if the condition fails to improve as anticipated.  I provided 15 minutes of face-to-face time during this encounter.   Mora Bellman, MD [facility]

## 2020-11-07 IMAGING — US US PELVIS COMPLETE WITH TRANSVAGINAL
1 series · 15 of 25 positions shown · non-contrast
Comparison: 09/10/2017

CLINICAL DATA: Uterine fibroids status post myomectomy, abnormal
uterine bleeding



[Series 1: us pelvis complete with transvaginal · 15 of 104 slices shown]
[im 1/104]
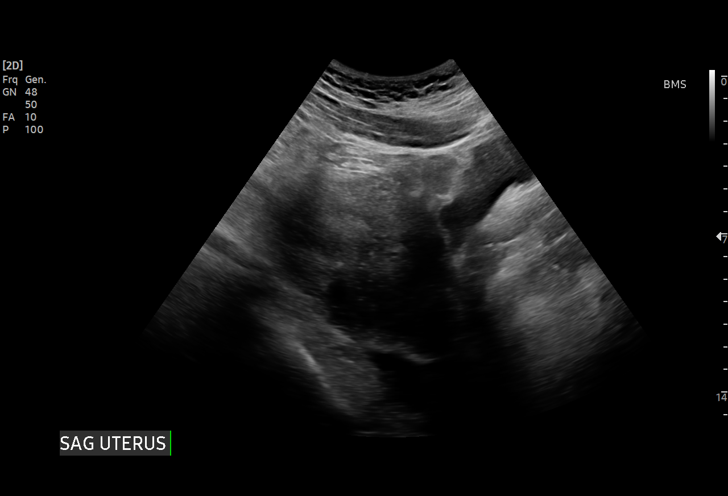
[im 9/104]
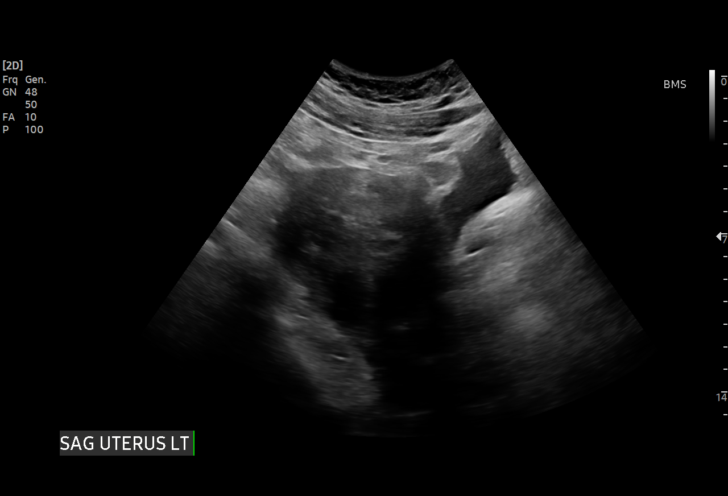
[im 18/104]
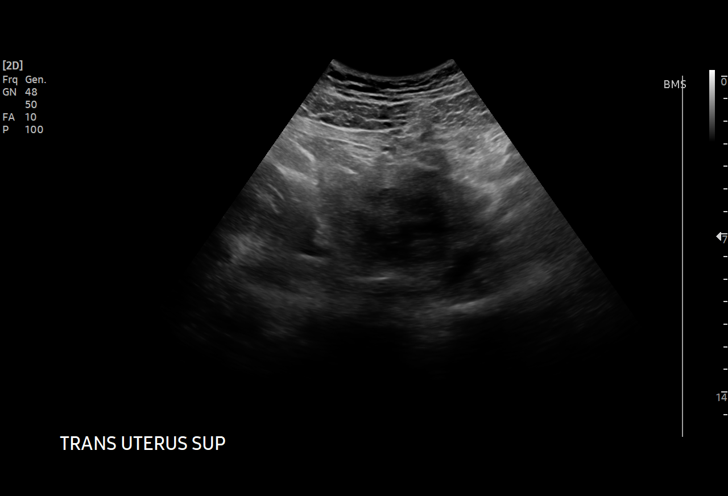
[im 22/104]
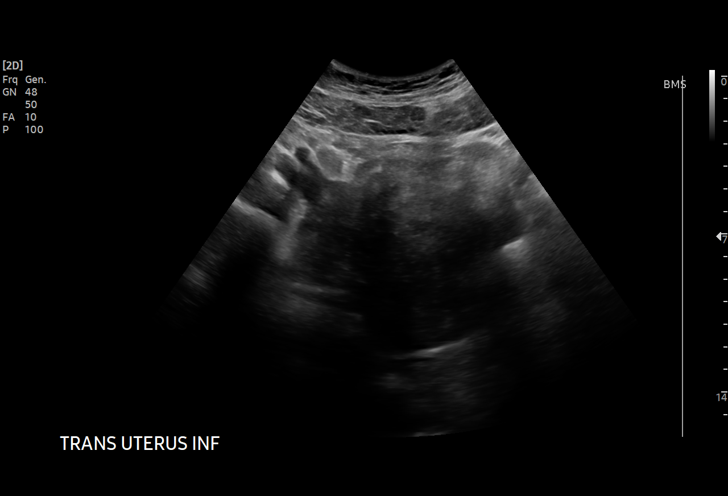
[im 31/104]
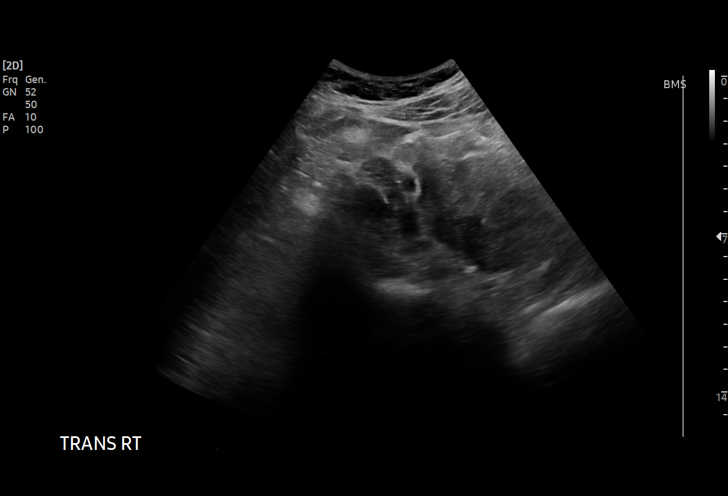
[im 39/104]
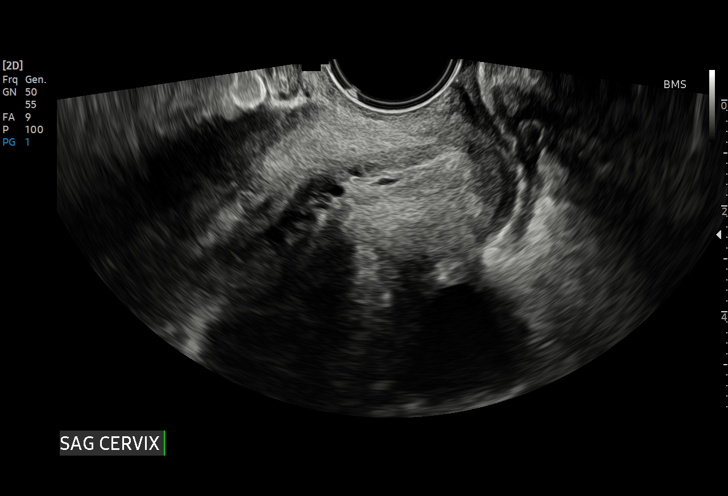
[im 43/104]
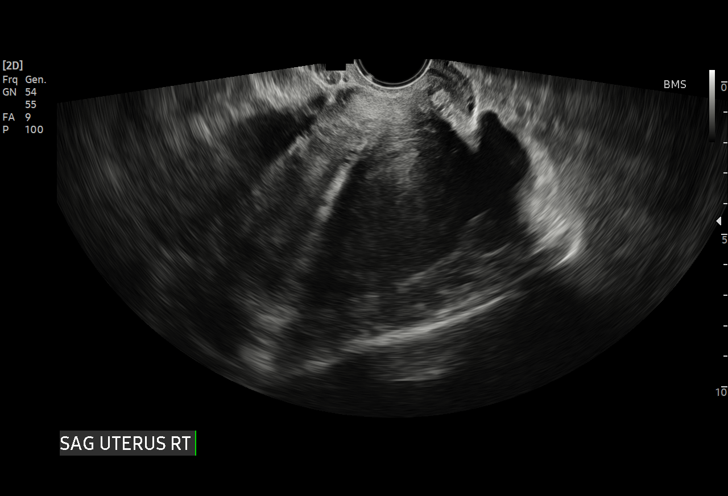
[im 52/104]
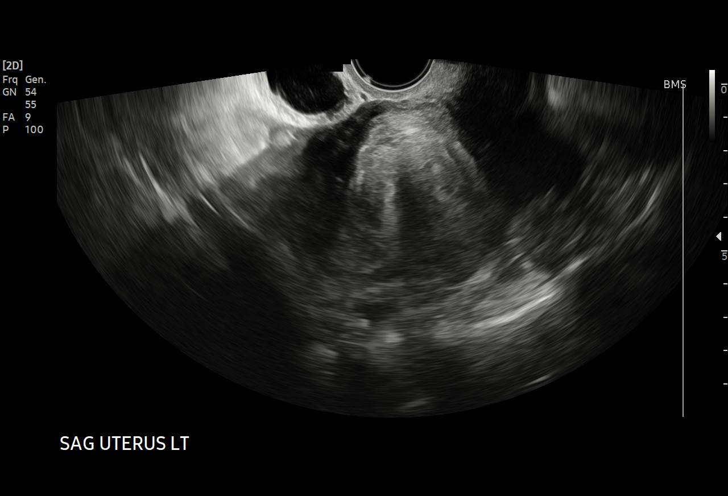
[im 61/104]
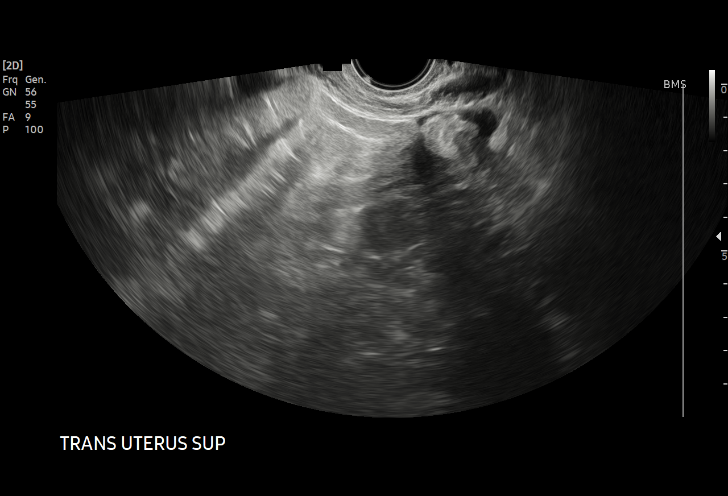
[im 65/104]
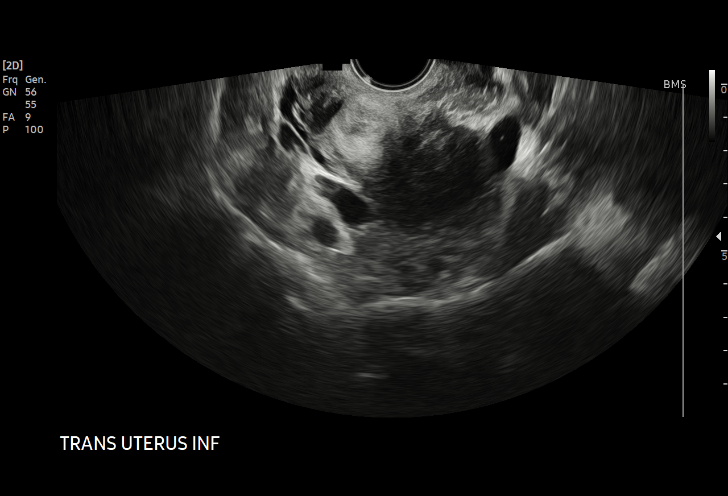
[im 73/104]
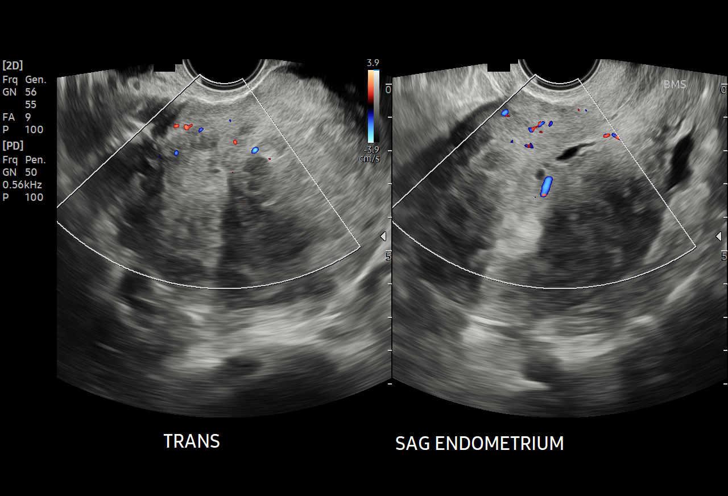
[im 82/104]
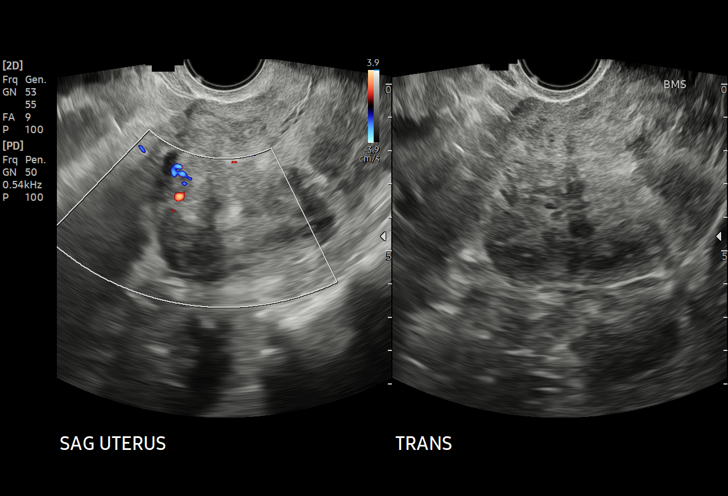
[im 86/104]
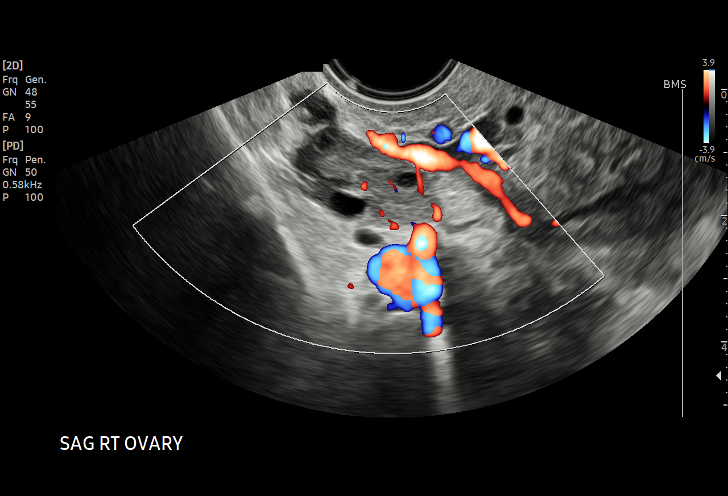
[im 95/104]
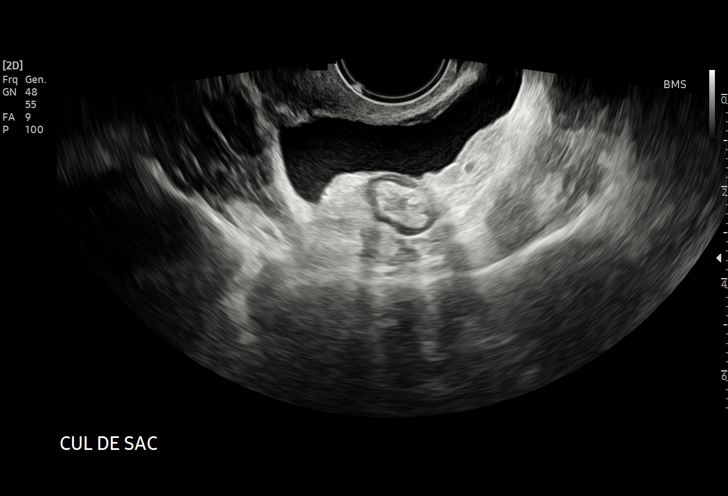
[im 104/104]
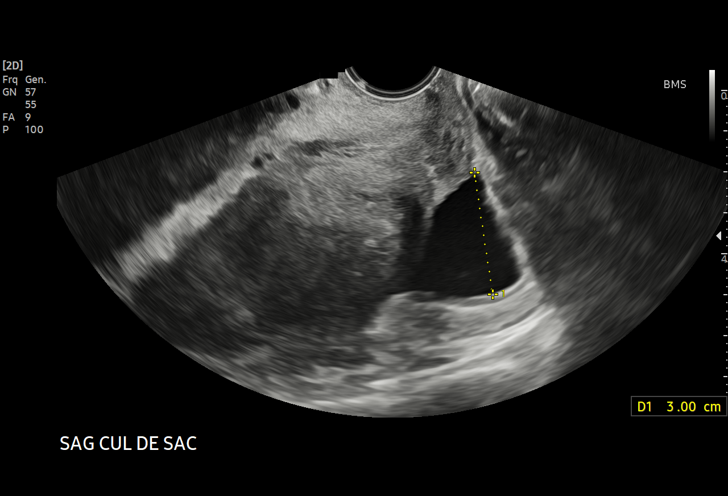

[15 of 25 positions shown; findings below may reference images not displayed]

FINDINGS: Uterus

Measurements: 10.7 x 7.2 x 9.2 = volume: 367 mL. There are multiple
heterogeneously hypoechoic solid mass is noted within the fundus
most in keeping with multiple uterine fibroids. The dominant lesion
measures 4.1 x 4.2 x 2.9 cm within the left posterolateral fundus. A
vascularized hypoechoic mass is seen best on image # 73 protruding
endophytic Ihatsu into the endometrial cavity likely representing a a
intracavitary fibroid measuring 1.9 x 1.3 x 1.0 cm. The cervix is
unremarkable.

Endometrium

Thickness: 11 mm.  No focal abnormality visualized.

Right ovary

Measurements: 3.5 x 1.8 x 3.0 cm = volume: 10 mL. Normal
appearance/no adnexal mass.

Left ovary

Measurements: 3.2 x 0.9 x 0.8 cm = volume: 1 mL. Normal
appearance/no adnexal mass.

Other findings

A small amount of simple appearing free fluid is seen within the
cul-de-sac, possibly physiologic.
IMPRESSION: 1. Fibroid uterus. Suspected 1.9 cm intracavitary fibroid. This
could be confirmed with MRI examination if indicated.
2. Small amount of simple appearing free fluid within the
cul-de-sac, possibly physiologic.
3. Otherwise unremarkable pelvic ultrasound.

## 2021-02-17 ENCOUNTER — Ambulatory Visit: Payer: No Typology Code available for payment source | Admitting: Obstetrics and Gynecology

## 2021-02-20 ENCOUNTER — Telehealth: Payer: No Typology Code available for payment source | Admitting: Family

## 2021-02-20 DIAGNOSIS — R21 Rash and other nonspecific skin eruption: Secondary | ICD-10-CM | POA: Diagnosis not present

## 2021-02-20 MED ORDER — TRIAMCINOLONE ACETONIDE 0.1 % EX CREA: 1.0000 "application " | TOPICAL_CREAM | Freq: Two times a day (BID) | CUTANEOUS | 0 refills | Status: DC

## 2021-02-20 NOTE — Progress Notes (Signed)
Virtual Visit Consent   Katherine Ingram, you are scheduled for a virtual visit with a [facility] provider today.     Just as with appointments in the office, your consent must be obtained to participate.  Your consent will be active for this visit and any virtual visit you may have with one of our providers in the next 365 days.     If you have a MyChart account, a copy of this consent can be sent to you electronically.  All virtual visits are billed to your insurance company just like a traditional visit in the office.    As this is a virtual visit, video technology does not allow for your provider to perform a traditional examination.  This may limit your provider's ability to fully assess your condition.  If your provider identifies any concerns that need to be evaluated in person or the need to arrange testing (such as labs, EKG, etc.), we will make arrangements to do so.     Although advances in technology are sophisticated, we cannot ensure that it will always work on either your end or our end.  If the connection with a video visit is poor, the visit may have to be switched to a telephone visit.  With either a video or telephone visit, we are not always able to ensure that we have a secure connection.     I need to obtain your verbal consent now.   Are you willing to proceed with your visit today?    Katherine Ingram has provided verbal consent on 02/20/2021 for a virtual visit (video or telephone).   Evelina Dun, FNP   Date: 02/20/2021 6:06 PM   Virtual Visit via Video Note   I, Evelina Dun, connected with  Katherine Ingram  ([id number], Oct 12, 1980) on 02/20/21 at  6:00 PM EST by a video-enabled telemedicine application and verified that I am speaking with the correct person using two identifiers.  Location: Patient: Virtual Visit Location Patient: Home Provider: Virtual Visit Location Provider: Home Office   I discussed the limitations of evaluation and management by telemedicine  and the availability of in person appointments. The patient expressed understanding and agreed to proceed.    History of Present Illness: Katherine Ingram is a 40 y.o. who identifies as a female who was assigned female at birth, and is being seen today for skin rash on right buttocks that she noticed on Tuesday and has worsen. She felt like something bite her Tuesday and the itching and irrigation has worsen since Thursday. She has applied steroid without relief. She applied benadryl cream with mild relief. Reports mild aching pain 3 out 10. Denies any redness, fever, or discharge.   HPI: Rash This is a new problem. The current episode started in the past 7 days. The problem has been gradually worsening since onset. Location: right buttox. The rash is characterized by itchiness and blistering. She was exposed to an insect bite/sting. Pertinent negatives include no congestion, cough, diarrhea, fatigue, joint pain or shortness of breath. Past treatments include anti-itch cream. The treatment provided mild relief.   Problems:  Patient Active Problem List   Diagnosis Date Noted   Submucous leiomyoma of uterus    S/P myomectomy 01/09/2017   Intramural, submucous, and subserous leiomyoma of uterus    Anemia 12/29/2016   Fibroids 11/09/2016   Menorrhagia 11/09/2016   HSIL (high grade squamous intraepithelial lesion) on Pap smear of cervix 08/23/2012    Allergies:  Allergies  Allergen Reactions  Penicillins Rash   Medications:  Current Outpatient Medications:    triamcinolone cream (KENALOG) 0.1 %, Apply 1 application topically 2 (two) times daily., Disp: 60 g, Rfl: 0   ALPRAZolam (XANAX) 0.25 MG tablet, Take 0.25 mg by mouth daily as needed., Disp: , Rfl:    cetirizine (ZYRTEC) 10 MG tablet, Take 10 mg by mouth daily., Disp: , Rfl:    ferrous sulfate 324 MG TBEC, Take 324 mg by mouth daily with breakfast., Disp: , Rfl:    Multiple Vitamin (MULTIVITAMIN WITH MINERALS) TABS tablet, Take 3 tablets  by mouth daily., Disp: , Rfl:    oxyCODONE-acetaminophen (PERCOCET/ROXICET) 5-325 MG tablet, Take 1 tablet by mouth every 6 (six) hours as needed., Disp: 15 tablet, Rfl: 0   zolpidem (AMBIEN) 5 MG tablet, Take 5 mg by mouth at bedtime as needed., Disp: , Rfl:   Observations/Objective: Patient is well-developed, well-nourished in no acute distress.  Resting comfortably  at home.  Head is normocephalic, atraumatic.  No labored breathing.  Speech is clear and coherent with logical content.  Patient is alert and oriented at baseline.  Dime size rash on right buttocks, no redness or swelling noted  Assessment and Plan: 1. Rash and nonspecific skin eruption - triamcinolone cream (KENALOG) 0.1 %; Apply 1 application topically 2 (two) times daily.  Dispense: 60 g; Refill: 0 Keep clean and dry  Avoid scratching  Report any increased redness, swelling, pain, or discharge  Follow Up Instructions: I discussed the assessment and treatment plan with the patient. The patient was provided an opportunity to ask questions and all were answered. The patient agreed with the plan and demonstrated an understanding of the instructions.  A copy of instructions were sent to the patient via MyChart unless otherwise noted below.     The patient was advised to call back or seek an in-person evaluation if the symptoms worsen or if the condition fails to improve as anticipated.  Time:  I spent 13 minutes with the patient via telehealth technology discussing the above problems/concerns.    Evelina Dun, FNP

## 2021-05-24 ENCOUNTER — Other Ambulatory Visit: Payer: Self-pay | Admitting: Obstetrics and Gynecology

## 2021-06-20 ENCOUNTER — Other Ambulatory Visit: Payer: Self-pay | Admitting: Obstetrics and Gynecology

## 2021-06-21 ENCOUNTER — Other Ambulatory Visit: Payer: Self-pay | Admitting: Obstetrics and Gynecology

## 2021-06-21 DIAGNOSIS — Z1231 Encounter for screening mammogram for malignant neoplasm of breast: Secondary | ICD-10-CM

## 2021-06-27 ENCOUNTER — Encounter (HOSPITAL_BASED_OUTPATIENT_CLINIC_OR_DEPARTMENT_OTHER): Payer: Self-pay | Admitting: Obstetrics and Gynecology

## 2021-06-27 ENCOUNTER — Other Ambulatory Visit: Payer: Self-pay

## 2021-06-27 DIAGNOSIS — I10 Essential (primary) hypertension: Secondary | ICD-10-CM

## 2021-06-27 HISTORY — DX: Essential (primary) hypertension: I10

## 2021-06-27 NOTE — Progress Notes (Addendum)
Spoke w/ via phone for pre-op interview---[place] ?Lab needs dos----CBC, BMP, type & screen, EKG, urine pregnancy               ?Lab results------none ?COVID test -----patient states asymptomatic no test needed ?Arrive at -------1115 on Friday, 07/01/21 ?NPO after MN NO Solid Food.  Clear liquids from MN until---1015 ?Med rec completed ?Medications to take morning of surgery -----Zyrtec if needed, Nifedipine ?Diabetic medication -----n/a ?Patient instructed no nail polish to be worn day of surgery ?Patient instructed to bring photo id and insurance card day of surgery ?Patient aware to have Driver (ride ) / caregiver    for 24 hours after surgery - mother, Mateo Flow ?Patient Special Instructions -----none ?Pre-Op special Istructions -----none ?Patient verbalized understanding of instructions that were given at this phone interview. ?Patient denies shortness of breath, chest pain, fever, cough at this phone interview.  ? ?Patient went for a doctor's visit today, 06/27/21 and was prescribed blood pressure medication. She had not picked it up at the time of our call, and did not know what medication had been prescribed. I instructed her to call me back today or tomorrow and let me know which medication was prescribed so that I could advise on whether or not to take it on the morning of surgery. Patient verbalized understanding and was agreeable. ? ?Patient called back and let me know that she had been prescribed Nifedipine. I instructed her that she could take Nifedipine the morning of surgery. ?

## 2021-07-01 ENCOUNTER — Encounter (HOSPITAL_BASED_OUTPATIENT_CLINIC_OR_DEPARTMENT_OTHER): Payer: Self-pay | Admitting: Obstetrics and Gynecology

## 2021-07-01 ENCOUNTER — Ambulatory Visit (HOSPITAL_BASED_OUTPATIENT_CLINIC_OR_DEPARTMENT_OTHER): Payer: No Typology Code available for payment source | Admitting: Anesthesiology

## 2021-07-01 ENCOUNTER — Ambulatory Visit (HOSPITAL_BASED_OUTPATIENT_CLINIC_OR_DEPARTMENT_OTHER)
Admission: RE | Admit: 2021-07-01 | Discharge: 2021-07-01 | Disposition: A | Discharge status: Home / Self Care | Payer: No Typology Code available for payment source | Attending: Obstetrics and Gynecology | Admitting: Obstetrics and Gynecology

## 2021-07-01 ENCOUNTER — Other Ambulatory Visit: Payer: Self-pay

## 2021-07-01 ENCOUNTER — Encounter (HOSPITAL_BASED_OUTPATIENT_CLINIC_OR_DEPARTMENT_OTHER)
Admission: RE | Disposition: A | Discharge status: Home / Self Care | Payer: Self-pay | Source: Home / Self Care | Attending: Obstetrics and Gynecology

## 2021-07-01 DIAGNOSIS — N920 Excessive and frequent menstruation with regular cycle: Secondary | ICD-10-CM | POA: Insufficient documentation

## 2021-07-01 DIAGNOSIS — D252 Subserosal leiomyoma of uterus: Secondary | ICD-10-CM

## 2021-07-01 DIAGNOSIS — D250 Submucous leiomyoma of uterus: Secondary | ICD-10-CM | POA: Diagnosis not present

## 2021-07-01 DIAGNOSIS — Z01818 Encounter for other preprocedural examination: Secondary | ICD-10-CM

## 2021-07-01 DIAGNOSIS — N840 Polyp of corpus uteri: Secondary | ICD-10-CM

## 2021-07-01 DIAGNOSIS — D251 Intramural leiomyoma of uterus: Secondary | ICD-10-CM

## 2021-07-01 DIAGNOSIS — I10 Essential (primary) hypertension: Secondary | ICD-10-CM | POA: Insufficient documentation

## 2021-07-01 DIAGNOSIS — K219 Gastro-esophageal reflux disease without esophagitis: Secondary | ICD-10-CM | POA: Diagnosis not present

## 2021-07-01 DIAGNOSIS — F419 Anxiety disorder, unspecified: Secondary | ICD-10-CM | POA: Insufficient documentation

## 2021-07-01 HISTORY — PX: DILATATION & CURETTAGE/HYSTEROSCOPY WITH MYOSURE: SHX6511

## 2021-07-01 HISTORY — DX: Anxiety disorder, unspecified: F41.9

## 2021-07-01 LAB — CBC
HCT: 47.6 % — ABNORMAL HIGH (ref 36.0–46.0)
Hemoglobin: 14.6 g/dL (ref 12.0–15.0)
MCH: 24.1 pg — ABNORMAL LOW (ref 26.0–34.0)
MCHC: 30.7 g/dL (ref 30.0–36.0)
MCV: 78.7 fL — ABNORMAL LOW (ref 80.0–100.0)
Platelets: 337 10*3/uL (ref 150–400)
RBC: 6.05 MIL/uL — ABNORMAL HIGH (ref 3.87–5.11)
RDW: 18.5 % — ABNORMAL HIGH (ref 11.5–15.5)
WBC: 9.4 10*3/uL (ref 4.0–10.5)
nRBC: 0 % (ref 0.0–0.2)

## 2021-07-01 LAB — BASIC METABOLIC PANEL
Anion gap: 8 (ref 5–15)
BUN: 12 mg/dL (ref 6–20)
CO2: 22 mmol/L (ref 22–32)
Calcium: 10 mg/dL (ref 8.9–10.3)
Chloride: 105 mmol/L (ref 98–111)
Creatinine, Ser: 0.98 mg/dL (ref 0.44–1.00)
GFR, Estimated: >60 mL/min (ref >60)
Glucose, Bld: 102 mg/dL — ABNORMAL HIGH (ref 70–99)
Potassium: 4.9 mmol/L (ref 3.5–5.1)
Sodium: 135 mmol/L (ref 135–145)

## 2021-07-01 LAB — TYPE AND SCREEN
ABO/RH(D): O POS
Antibody Screen: NEGATIVE

## 2021-07-01 LAB — POCT PREGNANCY, URINE: Preg Test, Ur: NEGATIVE

## 2021-07-01 SURGERY — DILATATION & CURETTAGE/HYSTEROSCOPY WITH MYOSURE
Anesthesia: General | Site: Uterus

## 2021-07-01 MED ORDER — POVIDONE-IODINE 10 % EX SWAB: 2.0000 "application " | Freq: Once | CUTANEOUS | Status: DC

## 2021-07-01 MED ORDER — ONDANSETRON HCL 4 MG/2ML IJ SOLN: 4.0000 mg | Freq: Once | INTRAMUSCULAR | Status: DC | PRN

## 2021-07-01 MED ORDER — OXYCODONE HCL 5 MG PO TABS
ORAL_TABLET | ORAL | Status: AC
  Filled 2021-07-01: qty 1

## 2021-07-01 MED ORDER — KETOROLAC TROMETHAMINE 30 MG/ML IJ SOLN
INTRAMUSCULAR | Status: AC
  Filled 2021-07-01: qty 1

## 2021-07-01 MED ORDER — ACETAMINOPHEN 500 MG PO TABS
ORAL_TABLET | ORAL | Status: AC
  Filled 2021-07-01: qty 2

## 2021-07-01 MED ORDER — MIDAZOLAM HCL 2 MG/2ML IJ SOLN
INTRAMUSCULAR | Status: AC
  Filled 2021-07-01: qty 2

## 2021-07-01 MED ORDER — DEXAMETHASONE SODIUM PHOSPHATE 10 MG/ML IJ SOLN
INTRAMUSCULAR | Status: AC
  Filled 2021-07-01: qty 1

## 2021-07-01 MED ORDER — LIDOCAINE 2% (20 MG/ML) 5 ML SYRINGE
INTRAMUSCULAR | Status: DC | PRN
  Administered 2021-07-01: 60 mg via INTRAVENOUS

## 2021-07-01 MED ORDER — FENTANYL CITRATE (PF) 100 MCG/2ML IJ SOLN
INTRAMUSCULAR | Status: AC
Start: 2021-07-01 — End: ?
  Filled 2021-07-01: qty 2

## 2021-07-01 MED ORDER — FENTANYL CITRATE (PF) 100 MCG/2ML IJ SOLN
INTRAMUSCULAR | Status: DC | PRN
Start: 2021-07-01 — End: 2021-07-01
  Administered 2021-07-01: 25 ug via INTRAVENOUS
  Administered 2021-07-01: 100 ug via INTRAVENOUS
  Administered 2021-07-01: 50 ug via INTRAVENOUS
  Administered 2021-07-01: 25 ug via INTRAVENOUS

## 2021-07-01 MED ORDER — ONDANSETRON HCL 4 MG/2ML IJ SOLN
INTRAMUSCULAR | Status: AC
  Filled 2021-07-01: qty 2

## 2021-07-01 MED ORDER — OXYCODONE HCL 5 MG/5ML PO SOLN: 5.0000 mg | Freq: Once | ORAL | Status: AC | PRN

## 2021-07-01 MED ORDER — ONDANSETRON HCL 4 MG/2ML IJ SOLN
INTRAMUSCULAR | Status: DC | PRN
  Administered 2021-07-01: 4 mg via INTRAVENOUS

## 2021-07-01 MED ORDER — AMISULPRIDE (ANTIEMETIC) 5 MG/2ML IV SOLN: 10.0000 mg | Freq: Once | INTRAVENOUS | Status: DC | PRN

## 2021-07-01 MED ORDER — ACETAMINOPHEN 160 MG/5ML PO SUSP: 1000.0000 mg | Freq: Four times a day (QID) | ORAL | 0 refills | Status: AC

## 2021-07-01 MED ORDER — SODIUM CHLORIDE 0.9 % IR SOLN
Status: DC | PRN
  Administered 2021-07-01 (×2): 3000 mL

## 2021-07-01 MED ORDER — HYDROMORPHONE HCL 1 MG/ML IJ SOLN
INTRAMUSCULAR | Status: AC
  Filled 2021-07-01: qty 1

## 2021-07-01 MED ORDER — KETOROLAC TROMETHAMINE 30 MG/ML IJ SOLN: 30.0000 mg | Freq: Once | INTRAMUSCULAR | Status: DC | PRN

## 2021-07-01 MED ORDER — OXYCODONE HCL 5 MG PO TABS
5.0000 mg | ORAL_TABLET | Freq: Once | ORAL | Status: AC | PRN
  Administered 2021-07-01: 5 mg via ORAL

## 2021-07-01 MED ORDER — MIDAZOLAM HCL 5 MG/5ML IJ SOLN
INTRAMUSCULAR | Status: DC | PRN
  Administered 2021-07-01: 2 mg via INTRAVENOUS

## 2021-07-01 MED ORDER — PROPOFOL 10 MG/ML IV BOLUS
INTRAVENOUS | Status: AC
  Filled 2021-07-01: qty 20

## 2021-07-01 MED ORDER — DEXAMETHASONE SODIUM PHOSPHATE 10 MG/ML IJ SOLN
INTRAMUSCULAR | Status: DC | PRN
  Administered 2021-07-01: 10 mg via INTRAVENOUS

## 2021-07-01 MED ORDER — HYDROMORPHONE HCL 1 MG/ML IJ SOLN
0.2500 mg | INTRAMUSCULAR | Status: DC | PRN
  Administered 2021-07-01: 0.25 mg via INTRAVENOUS

## 2021-07-01 MED ORDER — DEXMEDETOMIDINE (PRECEDEX) IN NS 20 MCG/5ML (4 MCG/ML) IV SYRINGE
PREFILLED_SYRINGE | INTRAVENOUS | Status: AC
  Filled 2021-07-01: qty 5

## 2021-07-01 MED ORDER — MEPERIDINE HCL 25 MG/ML IJ SOLN: 6.2500 mg | INTRAMUSCULAR | Status: DC | PRN

## 2021-07-01 MED ORDER — IBUPROFEN 100 MG/5ML PO SUSP: 600.0000 mg | Freq: Four times a day (QID) | ORAL | 0 refills | Status: AC

## 2021-07-01 MED ORDER — LIDOCAINE HCL (PF) 2 % IJ SOLN
INTRAMUSCULAR | Status: AC
  Filled 2021-07-01: qty 5

## 2021-07-01 MED ORDER — KETOROLAC TROMETHAMINE 30 MG/ML IJ SOLN
INTRAMUSCULAR | Status: DC | PRN
  Administered 2021-07-01: 30 mg via INTRAVENOUS

## 2021-07-01 MED ORDER — SCOPOLAMINE 1 MG/3DAYS TD PT72
MEDICATED_PATCH | TRANSDERMAL | Status: AC
  Filled 2021-07-01: qty 1

## 2021-07-01 MED ORDER — STERILE WATER FOR IRRIGATION IR SOLN
Status: DC | PRN
  Administered 2021-07-01: 500 mL

## 2021-07-01 MED ORDER — FENTANYL CITRATE (PF) 100 MCG/2ML IJ SOLN
INTRAMUSCULAR | Status: AC
  Filled 2021-07-01: qty 2

## 2021-07-01 MED ORDER — SCOPOLAMINE 1 MG/3DAYS TD PT72
1.0000 | MEDICATED_PATCH | TRANSDERMAL | Status: DC
  Administered 2021-07-01: 1.5 mg via TRANSDERMAL

## 2021-07-01 MED ORDER — PROPOFOL 10 MG/ML IV BOLUS
INTRAVENOUS | Status: DC | PRN
  Administered 2021-07-01: 200 mg via INTRAVENOUS

## 2021-07-01 MED ORDER — LACTATED RINGERS IV SOLN: INTRAVENOUS | Status: DC

## 2021-07-01 MED ORDER — ACETAMINOPHEN 500 MG PO TABS
1000.0000 mg | ORAL_TABLET | Freq: Once | ORAL | Status: AC
  Administered 2021-07-01: 1000 mg via ORAL

## 2021-07-01 MED ORDER — DEXMEDETOMIDINE (PRECEDEX) IN NS 20 MCG/5ML (4 MCG/ML) IV SYRINGE
PREFILLED_SYRINGE | INTRAVENOUS | Status: DC | PRN
  Administered 2021-07-01: 8 ug via INTRAVENOUS

## 2021-07-01 MED ORDER — PHENYLEPHRINE HCL (PRESSORS) 10 MG/ML IV SOLN
INTRAVENOUS | Status: DC | PRN
  Administered 2021-07-01 (×3): 80 ug via INTRAVENOUS

## 2021-07-01 MED ORDER — GLYCOPYRROLATE PF 0.2 MG/ML IJ SOSY
PREFILLED_SYRINGE | INTRAMUSCULAR | Status: AC
  Filled 2021-07-01: qty 1

## 2021-07-01 SURGICAL SUPPLY — 27 items
CATH ROBINSON RED A/P 16FR (CATHETERS) ×2 IMPLANT
DEVICE MYOSURE LITE (MISCELLANEOUS) IMPLANT
DEVICE MYOSURE REACH (MISCELLANEOUS) ×1 IMPLANT
DILATOR CANAL MILEX (MISCELLANEOUS) ×1 IMPLANT
DRSG TELFA 3X8 NADH (GAUZE/BANDAGES/DRESSINGS) ×2 IMPLANT
ELECT DISPERSIVE SONATA (ELECTRODE) ×4 IMPLANT
ELECT REM PT RETURN 9FT ADLT (ELECTROSURGICAL) ×2
ELECTRODE REM PT RTRN 9FT ADLT (ELECTROSURGICAL) ×1 IMPLANT
GAUZE 4X4 16PLY ~~LOC~~+RFID DBL (SPONGE) ×2 IMPLANT
GLOVE SURG LTX SZ6.5 (GLOVE) ×2 IMPLANT
GLOVE SURG UNDER POLY LF SZ7 (GLOVE) ×2 IMPLANT
GOWN STRL REUS W/TWL LRG LVL3 (GOWN DISPOSABLE) ×2 IMPLANT
HANDPIECE RFA SONATA (MISCELLANEOUS) ×2 IMPLANT
IV NS IRRIG 3000ML ARTHROMATIC (IV SOLUTION) ×3 IMPLANT
KIT PROCEDURE FLUENT (KITS) ×2 IMPLANT
KIT TURNOVER CYSTO (KITS) ×2 IMPLANT
MYOSURE XL FIBROID (MISCELLANEOUS)
PACK VAGINAL MINOR WOMEN LF (CUSTOM PROCEDURE TRAY) ×2 IMPLANT
PAD DRESSING TELFA 3X8 NADH (GAUZE/BANDAGES/DRESSINGS) ×1 IMPLANT
PAD OB MATERNITY 4.3X12.25 (PERSONAL CARE ITEMS) ×2 IMPLANT
PAD PREP 24X48 CUFFED NSTRL (MISCELLANEOUS) ×2 IMPLANT
SEAL CERVICAL OMNI LOK (ABLATOR) IMPLANT
SEAL ROD LENS SCOPE MYOSURE (ABLATOR) ×2 IMPLANT
SYR 50ML LL SCALE MARK (SYRINGE) ×2 IMPLANT
SYSTEM TISS REMOVAL MYOSURE XL (MISCELLANEOUS) IMPLANT
TOWEL OR 17X26 10 PK STRL BLUE (TOWEL DISPOSABLE) ×2 IMPLANT
WATER STERILE IRR 500ML POUR (IV SOLUTION) ×2 IMPLANT

## 2021-07-01 NOTE — Interval H&P Note (Signed)
History and Physical Interval Note: ? ?07/01/2021 ?1:07 PM ? ?Katherine Ingram  has presented today for surgery, with the diagnosis of Menorrhagia, Uterine fibroid ?Endometrial polyp.  The various methods of treatment have been discussed with the patient and family. After consideration of risks, benefits and other options for treatment, the patient has consented to  PROCEDURE: [place], DIAGNOSTIC HYSTEROSCOPY, HYSTEROSCOPIC RESECTION OF ENDOMETRIAL POLYP IF PRESENT, DILATION AND CURETTAGE as a surgical intervention.  The patient's history has been reviewed, patient examined, no change in status, stable for surgery.  I have reviewed the patient's chart and labs.  Questions were answered to the patient's satisfaction.   ? ? ?[name] ? ? ?

## 2021-07-01 NOTE — Anesthesia Procedure Notes (Signed)
Procedure Name: LMA Insertion ?Date/Time: 07/01/2021 1:26 PM ?Performed by: Rogers Blocker, CRNA ?Pre-anesthesia Checklist: Patient identified, Emergency Drugs available, Suction available and Patient being monitored ?Patient Re-evaluated:Patient Re-evaluated prior to induction ?Oxygen Delivery Method: Circle System Utilized ?Preoxygenation: Pre-oxygenation with 100% oxygen ?Induction Type: IV induction ?Ventilation: Mask ventilation without difficulty ?LMA: LMA inserted ?LMA Size: 4.0 ?Number of attempts: 1 ?Placement Confirmation: positive ETCO2 ?Tube secured with: Tape ?Dental Injury: Teeth and Oropharynx as per pre-operative assessment  ? ? ? ? ?

## 2021-07-01 NOTE — H&P (Signed)
Katherine Ingram is an 41 y.o. female. BF presents for surgical management of symptomatic uterine fibroid and uterine polyp.  ? ?Pertinent Gynecological History: ?Menses: flow is excessive with use of 5 pads or tampons on heaviest days ?Bleeding: dysfunctional uterine bleeding ?Contraception: none ?DES exposure: denies ?Blood transfusions:  no ?Sexually transmitted diseases: no past history ?Previous GYN Procedures: DNC  ?Last mammogram:  pending  Date: 2023 ?Last pap: normal Date: 2023  ? ?Menstrual History: ?Menarche age: n/a ?Patient's last menstrual period was 05/18/2021 (exact date). ?  ? ?Past Medical History:  ?Diagnosis Date  ? Abnormal Pap smear   ? Anemia 12/29/2016  ? Hgb 5.1  ? Anxiety   ? situational, better now as of 06/27/21  ? Fibroid   ? GERD (gastroesophageal reflux disease)   ?  NO MEDS, CAN'T SWALLOW BIG PILLS  ? History of blood transfusion 2018  ? History of hypertension   ? off medication since 2019  ? HSIL on Pap smear of cervix 2014  ? Hypertension 06/27/2021  ? Patient started on meds for hypertension on 06/27/21.  ? Wears glasses   ? ? ?Past Surgical History:  ?Procedure Laterality Date  ? ANKLE SURGERY  yrs ago  ? Lt ankle fracture screws placed  ? CERVICAL BIOPSY  W/ LOOP ELECTRODE EXCISION  2014  ? for HSIL  ? DILATATION & CURETTAGE/HYSTEROSCOPY WITH MYOSURE N/A 06/01/2020  ? Procedure: [place];  Surgeon: Mora Bellman, MD;  Location: [facility];  Service: Gynecology;  Laterality: N/A;  ? MYOMECTOMY N/A 01/09/2017  ? Procedure: ABDOMINAL MYOMECTOMY;  Surgeon: Mora Bellman, MD;  Location: [facility];  Service: Gynecology;  Laterality: N/A;  ? ? ?Family History  ?Problem Relation Age of Onset  ? Diabetes Father   ? Scoliosis Sister   ? ? ?Social History:  reports that she has never smoked. She has never used smokeless tobacco. She reports that she does not drink alcohol and does not use drugs. ? ?Allergies:  ?Allergies  ?Allergen  Reactions  ? Penicillins Rash  ? ? ?Medications Prior to Admission  ?Medication Sig Dispense Refill Last Dose  ? cetirizine (ZYRTEC) 10 MG tablet Take 10 mg by mouth as needed.   Past Month  ? ferrous sulfate 324 MG TBEC Take 324 mg by mouth daily with breakfast. Takes every once in a while   06/30/2021  ? FIBER ADULT GUMMIES PO Take by mouth. Metamucil fiber gummies 2 daily.   Past Week  ? megestrol (MEGACE) 20 MG tablet Take 20 mg by mouth daily.   Past Week  ? Multiple Vitamin (MULTIVITAMIN WITH MINERALS) TABS tablet Take 3 tablets by mouth daily.   Past Week  ? NIFEdipine (PROCARDIA-XL/NIFEDICAL-XL) 30 MG 24 hr tablet Take 30 mg by mouth daily. Unsure of dosage on 06/27/21.   07/01/2021 at 1015  ? NIFEdipine (PROCARDIA-XL/NIFEDICAL-XL) 30 MG 24 hr tablet Take 30 mg by mouth daily.     ? zolpidem (AMBIEN) 5 MG tablet Take 5 mg by mouth at bedtime as needed.   Past Month  ? ALPRAZolam (XANAX) 0.25 MG tablet Take 0.25 mg by mouth daily as needed. (Patient not taking: Reported on 06/27/2021)   Not Taking  ? ? ?Review of Systems  ?All other systems reviewed and are negative. ? ?Blood pressure (!) 162/93, pulse (!) 110, temperature 98.5 ?F (36.9 ?C), temperature source Oral, resp. rate 20, height '5\' 6"'$  (1.676 m), weight 78.5 kg, last menstrual period 05/18/2021, SpO2 100 %. ?Physical  Exam ?Constitutional:   ?   Appearance: Normal appearance.  ?HENT:  ?   Mouth/Throat:  ?   Mouth: Mucous membranes are moist.  ?Eyes:  ?   Extraocular Movements: Extraocular movements intact.  ?Cardiovascular:  ?   Rate and Rhythm: Regular rhythm.  ?   Heart sounds: Normal heart sounds.  ?Pulmonary:  ?   Breath sounds: Normal breath sounds.  ?Abdominal:  ?   Palpations: Abdomen is soft.  ?Genitourinary: ?   General: Normal vulva.  ?   Comments: Vagina no lesion ?Cervix closed ?Uterus 10 wk irreg ?Adnexa nl ?Musculoskeletal:     ?   General: Normal range of motion.  ?   Cervical back: Neck supple.  ?Skin: ?   General: Skin is warm.   ?Neurological:  ?   Mental Status: She is alert and oriented to person, place, and time.  ?Psychiatric:     ?   Behavior: Behavior normal.  ? ? ?Results for orders placed or performed during the hospital encounter of 07/01/21 (from the past 24 hour(s))  ?Pregnancy, urine POC     Status: None  ? Collection Time: 07/01/21 11:15 AM  ?Result Value Ref Range  ? Preg Test, Ur NEGATIVE NEGATIVE  ? ? ?No results found. ? ?Assessment/Plan: ?Menorhagia with regular cycles ?Uterine fibroids ?Endometrial polyp ?HTN new onset ?P) RF transcervical ablation of uterine fibroids using Sonata, hysteroscopic resection of endometrial polyp, dx hysteroscopy, D&C. Procedure explained. Risk of surgery reviewed including infection, bleeding, injury to surrounding organ structures, thermal injury, poss not being able to address all fibroids due to location and its risk, fluid overload. All ? answered ? ?Adeli Frost A Calah Gershman ?07/01/2021, 11:49 AM ? ?

## 2021-07-01 NOTE — Anesthesia Preprocedure Evaluation (Addendum)
Anesthesia Evaluation  ?Patient identified by MRN, date of birth, ID band ?Patient awake ? ? ? ?Reviewed: ?Allergy & Precautions, NPO status , Patient's Chart, lab work & pertinent test results ? ?Airway ?Mallampati: II ? ?TM Distance: >3 FB ?Neck ROM: Full ? ? ? Dental ?no notable dental hx. ? ?  ?Pulmonary ?neg pulmonary ROS,  ?  ?Pulmonary exam normal ?breath sounds clear to auscultation ? ? ? ? ? ? Cardiovascular ?hypertension (started on meds for HTN a few days ago after being off meds since 2019), Pt. on medications ?Normal cardiovascular exam ?Rhythm:Regular Rate:Normal ? ? ?  ?Neuro/Psych ?PSYCHIATRIC DISORDERS Anxiety negative neurological ROS ?   ? GI/Hepatic ?Neg liver ROS, GERD  Controlled,  ?Endo/Other  ?negative endocrine ROS ? Renal/GU ?negative Renal ROS  ?Female GU complaint ?Menorrhagia, uterine fibroid, endometrial polyp  ? ?  ?Musculoskeletal ?negative musculoskeletal ROS ?(+)  ? Abdominal ?  ?Peds ? Hematology ?negative hematology ROS ?(+)   ?Anesthesia Other Findings ? ? Reproductive/Obstetrics ?negative OB ROS ? ?  ? ? ? ? ? ? ? ? ? ? ? ? ? ?  ?  ? ? ? ? ? ? ? ?Anesthesia Physical ?Anesthesia Plan ? ?ASA: 3 ? ?Anesthesia Plan: General  ? ?Post-op Pain Management: Toradol IV (intra-op)* and Ofirmev IV (intra-op)*  ? ?Induction: Intravenous ? ?PONV Risk Score and Plan: 4 or greater and Ondansetron, Dexamethasone, Midazolam, Scopolamine patch - Pre-op and Treatment may vary due to age or medical condition ? ?Airway Management Planned: LMA ? ?Additional Equipment: None ? ?Intra-op Plan:  ? ?Post-operative Plan: Extubation in OR ? ?Informed Consent: I have reviewed the patients History and Physical, chart, labs and discussed the procedure including the risks, benefits and alternatives for the proposed anesthesia with the patient or authorized representative who has indicated his/her understanding and acceptance.  ? ? ? ?Dental advisory given ? ?Plan Discussed with:  CRNA ? ?Anesthesia Plan Comments:   ? ? ? ? ? ?Anesthesia Quick Evaluation ? ?

## 2021-07-01 NOTE — Brief Op Note (Signed)
07/01/2021 ? ?2:49 PM ? ?PATIENT:  Katherine Ingram  41 y.o. female ? ?PRE-OPERATIVE DIAGNOSIS:  Menorrhagia, Uterine fibroid ?Endometrial polyp ? ?POST-OPERATIVE DIAGNOSIS:  Menorrhagia, SM/IM/SS pedunculated fibroid, endometrial polyps ?Endometrial polyp ? ?PROCEDURE:  RF transcervical ablation of uterine fibroids using Sonata, dx hysteroscopy, hysteroscopic resection of endometrial  polyps, SM fibroids , D&C ? ?SURGEON:  Surgeon(s) and Role: ?   Servando Salina, MD - Primary ? ?PHYSICIAN ASSISTANT:  ? ?ASSISTANTS: none  ? ?ANESTHESIA:   general ?FINDINGS: multiple small pea size SM fibroids, larger post LUS SM fibroids, tubal ostia seen, multiple intramural fibroid, pedunculated lateral fibroid not ablated ?EBL:  5 mL  ? ?BLOOD ADMINISTERED:none ? ?DRAINS: none  ? ?LOCAL MEDICATIONS USED:  NONE ? ?SPECIMEN:  Source of Specimen:  EMC with fibroid resections and polyps ? ?DISPOSITION OF SPECIMEN:  PATHOLOGY ? ?COUNTS:  YES ? ?TOURNIQUET:  * No tourniquets in log * ? ?DICTATION: .Other Dictation: Dictation Number [id number] ? ?PLAN OF CARE: Discharge to home after PACU ? ?PATIENT DISPOSITION:  PACU - hemodynamically stable. ?  ?Delay start of Pharmacological VTE agent (>24hrs) due to surgical blood loss or risk of bleeding: no ? ?

## 2021-07-01 NOTE — Transfer of Care (Signed)
Immediate Anesthesia Transfer of Care Note ? ?Patient: Katherine Ingram ? ?Procedure(s) Performed: DILATATION & CURETTAGE/HYSTEROSCOPY WITH MYOSURE (Uterus) ?Radio Frequency Ablation with Sonata (Uterus) ? ?Patient Location: PACU ? ?Anesthesia Type:General ? ?Level of Consciousness: awake, alert , oriented and patient cooperative ? ?Airway & Oxygen Therapy: Patient Spontanous Breathing ? ?Post-op Assessment: Report given to RN and Post -op Vital signs reviewed and stable ? ?Post vital signs: Reviewed and stable ? ?Last Vitals:  ?Vitals Value Taken Time  ?BP 142/87 07/01/21 1445  ?Temp    ?Pulse 124 07/01/21 1447  ?Resp 19 07/01/21 1447  ?SpO2 100 % 07/01/21 1447  ?Vitals shown include unvalidated device data. ? ?Last Pain:  ?Vitals:  ? 07/01/21 1152  ?TempSrc:   ?PainSc: 3   ?   ? ?Patients Stated Pain Goal: 5 (07/01/21 1152) ? ?Complications: No notable events documented. ?

## 2021-07-01 NOTE — Op Note (Signed)
NAME: Katherine Ingram, Katherine Ingram ?MEDICAL RECORD NO: [id number] ?ACCOUNT NO: [id number] ?DATE OF BIRTH: 1980/09/18 ?FACILITY: [facility] ?LOCATION: [facility] ?PHYSICIAN: Ellis Koffler A. Garwin Brothers, MD ? ?Operative Report  ? ?DATE OF PROCEDURE: 07/01/2021 ? ?PREOPERATIVE DIAGNOSES:  Menorrhagia, fibroid uterus, hypertension. ? ?PROCEDURE:  Radiofrequency transcervical ablation of uterine fibroids using Sonata,  diagnostic hysteroscopy, hysteroscopic resection of submucosal fibroids and endometrial polyps, Dilation and  Curettage. ? ?POSTOPERATIVE DIAGNOSES:  Menorrhagia, submucosal, intramural and subserosal fibroids, endometrial polyps. ? ?ANESTHESIA:  General. ? ?SURGEON:  Servando Salina, MD ? ?ASSISTANT:  None. ? ?DESCRIPTION OF PROCEDURE:  Under adequate general anesthesia, the patient was placed in the dorsal lithotomy position.  She was sterilely prepped and draped in the usual fashion.  The patient voided prior to entering the room.  Examination under  ?anesthesia revealed irregular and anteverted uterus about 10-week size. Dispersive electric pads were placed on the anterior thighs of the patient.  The bivalve speculum was placed in the vagina.  A single-tooth tenaculum was placed on the anterior lip  ?of the cervix.  The cervix was then dilated up to #27 [facility] dilator.  Diagnostic hysteroscope was introduced into the uterine cavity.  The patient was actually on her cycle.  The cervix was slightly stenotic requiring the os finder to be used as well as  ?dilation that was gingerly done. The cavity had blood within it, evidence of a posterior fibroid.  The hysteroscope was removed. The Sonata treatment device was inserted.  This device has the ultrasound imaging with radiofrequency energy for delivery.   ?The fibroids were identified with the ultrasound probe based on the location of the fibroids, the ablation sized to optimize the ablation of the fibroid was done.  A lining with a graphic overlay of the targeted site over the  live image. The Sonata  ?piece, which had been inserted, showed several fibroids.  The patient had 4 fibroids that were ablated.  Fibroid #1 was 2 cm type 3, 2 o'clock position, the left lateral fundal with an ablative size of 2.2 x 1.5 for 1 minute and 30 seconds.  Fibroid #2 ? which was about 2 cm, type 3, 12 o'clock position mid anterior fundal, had an ablative size of 2.1 x 1.5 for 1 minute and 24 seconds.  Fibroid #3 which was 3 cm fibroid, type 4 at 2 o'clock position, left anterior mid, had an ablative size of 2.7 x 2.0  ?cm with an ablation of 2 minutes and 30 seconds.  The last fibroid was 2 cm type 3,  3 o'clock position with ablative size of 2.6 x 1.94, for 2 minutes and 18 seconds.  Once this was done, the Sonata handpiece was removed.  Hysteroscope was inserted.   ?The Reach resectoscope was used to evacuate the clot, which then allowed for evidence of some smaller submucosal fibroids, anterior fundal as well as posterior and in addition, some endometrial polyps.  A posterior submucosal fibroid, which was not  ?visualized initially.  Using the Reach resectoscope, both endometrial polyps were removed.  The  small submucosal fibroids were removed and the larger fibroid that was posterior had itself resected as well.  The endometrial cavity was also resected.   ?When the procedure was felt to be adequate, the hysteroscope was removed.  All instruments were then subsequently removed from the vagina.  All cycles were carried out under direct ultrasound intrauterine guidance and visualization with the ablation  ?guide noted to be within the serosa at all times.  The fibroids treated appeared  ablated with ultrasound guidance with outgassing noted by ultrasound appearance. On entering the uterine cavity, there was no evidence of ablation from the [place] on the  ?endometrial side. ? ?SPECIMEN:  Endometrial curettings with fibroid resection and polyp, sent to Pathology. ? ?ESTIMATED BLOOD LOSS:  5 mL. ? ?FLUID  DEFICIT:  2200 mL. ? ?COUNTS: Sponge and instrument counts x 2 was correct.  ? ?COMPLICATIONS: None.  ? ?DISPOSITION: The patient tolerated the procedure well and was transferred to recovery in stable condition. ? ? ?MUK ?D: 07/01/2021 11:24:51 pm T: 07/01/2021 11:44:00 pm  ?JOB: [id number]/ [id number]  ?

## 2021-07-01 NOTE — Discharge Instructions (Addendum)
CALL  IF TEMP>100.4, NOTHING PER VAGINA X 2 WK, CALL IF SOAKING A MAXI  PAD EVERY HOUR OR MORE FREQUENTLY  ? ? ? ?D & C Home care Instructions: ? ? ?Personal hygiene:  Used sanitary napkins for vaginal drainage not tampons. Shower or tub bathe the day after your procedure. No douching until bleeding stops. Always wipe from front to back after  Elimination. ? ?Activity: Do not drive or operate any equipment today. The effects of the anesthesia are still present and drowsiness may result. Rest today, not necessarily flat bed rest, just take it easy. You may resume your normal activity in one to 2 days. ? ?Sexual activity: No intercourse for one week or as indicated by your physician ? ?Diet: Eat a light diet as desired this evening. You may resume a regular diet tomorrow. ? ?Return to work: One to 2 days. ? ?General Expectations of your surgery: Vaginal bleeding should be no heavier than a normal period. Spotting may continue up to 10 days. Mild cramps may continue for a couple of days. You may have a regular period in 2-6 weeks. ? ?Unexpected observations call your doctor if these occur: persistent or heavy bleeding. Severe abdominal cramping or pain. Elevation of temperature greater than 100?F. ? ?Call for an appointment in one week. ? ? ?Post Anesthesia Home Care Instructions ? ?Activity: ?Get plenty of rest for the remainder of the day. A responsible individual must stay with you for 24 hours following the procedure.  ?For the next 24 hours, DO NOT: ?-Drive a car ?-Paediatric nurse ?-Drink alcoholic beverages ?-Take any medication unless instructed by your physician ?-Make any legal decisions or sign important papers. ? ?Meals: ?Start with liquid foods such as gelatin or soup. Progress to regular foods as tolerated. Avoid greasy, spicy, heavy foods. If nausea and/or vomiting occur, drink only clear liquids until the nausea and/or vomiting subsides. Call your physician if vomiting continues. ? ?Special  Instructions/Symptoms: ?Your throat may feel dry or sore from the anesthesia or the breathing tube placed in your throat during surgery. If this causes discomfort, gargle with warm salt water. The discomfort should disappear within 24 hours. ? ?If you had a scopolamine patch placed behind your ear for the management of post- operative nausea and/or vomiting: ? ?1. The medication in the patch is effective for 72 hours, after which it should be removed.  Wrap patch in a tissue and discard in the trash. Wash hands thoroughly with soap and water. ?2. You may remove the patch earlier than 72 hours if you experience unpleasant side effects which may include dry mouth, dizziness or visual disturbances. ?3. Avoid touching the patch. Wash your hands with soap and water after contact with the patch. ?   ?No acetaminophen/Tylenol until after 6:00 pm today if needed. ?No ibuprofen, Advil, Aleve, Motrin, ketorolac, meloxicam, naproxen, or other NSAIDS until after 7:30 pm today if needed. ? ? ?

## 2021-07-04 ENCOUNTER — Encounter (HOSPITAL_BASED_OUTPATIENT_CLINIC_OR_DEPARTMENT_OTHER): Payer: Self-pay | Admitting: Obstetrics and Gynecology

## 2021-07-04 LAB — SURGICAL PATHOLOGY

## 2021-07-04 NOTE — Anesthesia Postprocedure Evaluation (Signed)
Anesthesia Post Note ? ?Patient: Ismael Treptow ? ?Procedure(s) Performed: DILATATION & CURETTAGE/HYSTEROSCOPY WITH MYOSURE (Uterus) ?Radio Frequency Ablation with Sonata (Uterus) ? ?  ? ?Patient location during evaluation: PACU ?Anesthesia Type: General ?Level of consciousness: awake and alert, oriented and patient cooperative ?Pain management: pain level controlled ?Vital Signs Assessment: post-procedure vital signs reviewed and stable ?Respiratory status: spontaneous breathing, nonlabored ventilation and respiratory function stable ?Cardiovascular status: blood pressure returned to baseline and stable ?Postop Assessment: no apparent nausea or vomiting ?Anesthetic complications: no ? ? ?No notable events documented. ? ?Last Vitals:  ?Vitals:  ? 07/01/21 1515 07/01/21 1530  ?BP: (!) 134/107 (!) 145/104  ?Pulse: (!) 109 (!) 105  ?Resp: (!) 21 20  ?Temp:    ?SpO2: 100% 100%  ?  ?Last Pain:  ?Vitals:  ? 07/01/21 1530  ?TempSrc:   ?PainSc: 3   ? ? ?  ?  ?  ?  ?  ?  ? ?Jarome Matin Wen [place] ? ? ? ? ?

## 2021-07-08 DIAGNOSIS — Z1231 Encounter for screening mammogram for malignant neoplasm of breast: Secondary | ICD-10-CM

## 2021-09-20 ENCOUNTER — Other Ambulatory Visit: Payer: Self-pay | Admitting: Obstetrics and Gynecology

## 2022-04-30 ENCOUNTER — Telehealth: Payer: No Typology Code available for payment source | Admitting: Urgent Care

## 2022-04-30 DIAGNOSIS — J069 Acute upper respiratory infection, unspecified: Secondary | ICD-10-CM | POA: Diagnosis not present

## 2022-04-30 MED ORDER — IPRATROPIUM BROMIDE 0.03 % NA SOLN: 2.0000 | Freq: Two times a day (BID) | NASAL | 0 refills | Status: DC

## 2022-04-30 MED ORDER — BENZONATATE 100 MG PO CAPS: 100.0000 mg | ORAL_CAPSULE | Freq: Three times a day (TID) | ORAL | 0 refills | Status: DC | PRN

## 2022-04-30 NOTE — Progress Notes (Signed)
E-Visit for Upper Respiratory Infection   We are sorry you are not feeling well.  Here is how we plan to help!  Based on what you have shared with me, it looks like you may have a viral upper respiratory infection.  Upper respiratory infections are caused by a large number of viruses; however, rhinovirus is the most common cause.   Symptoms vary from person to person, with common symptoms including sore throat, cough, fatigue or lack of energy and feeling of general discomfort.  A low-grade fever of up to 100.4 may present, but is often uncommon.  Symptoms vary however, and are closely related to a person's age or underlying illnesses.  The most common symptoms associated with an upper respiratory infection are nasal discharge or congestion, cough, sneezing, headache and pressure in the ears and face.  These symptoms usually persist for about 3 to 10 days, but can last up to 2 weeks.  It is important to know that upper respiratory infections do not cause serious illness or complications in most cases.    Upper respiratory infections can be transmitted from person to person, with the most common method of transmission being a person's hands.  The virus is able to live on the skin and can infect other persons for up to 2 hours after direct contact.  Also, these can be transmitted when someone coughs or sneezes; thus, it is important to cover the mouth to reduce this risk.  To keep the spread of the illness at [place], good hand hygiene is very important.  This is an infection that is most likely caused by a virus. There are no specific treatments other than to help you with the symptoms until the infection runs its course.  We are sorry you are not feeling well.  Here is how we plan to help!   For nasal congestion, you may use an oral decongestants such as Mucinex D or if you have glaucoma or high blood pressure use plain Mucinex.  Saline nasal spray or nasal drops can help and can safely be used as often as  needed for congestion.  For your congestion, I have prescribed Ipratropium Bromide nasal spray 0.03% two sprays in each nostril 2-3 times a day  If you do not have a history of heart disease, hypertension, diabetes or thyroid disease, prostate/bladder issues or glaucoma, you may also use Sudafed to treat nasal congestion.  It is highly recommended that you consult with a pharmacist or your primary care physician to ensure this medication is safe for you to take.     If you have a cough, you may use cough suppressants such as Delsym and Robitussin.  If you have glaucoma or high blood pressure, you can also use Coricidin HBP.   For cough I have prescribed for you A prescription cough medication called Tessalon Perles 100 mg. You may take 1-2 capsules every 8 hours as needed for cough  If you have a sore or scratchy throat, use a saltwater gargle-  to  teaspoon of salt dissolved in a 4-ounce to 8-ounce glass of warm water.  Gargle the solution for approximately 15-30 seconds and then spit.  It is important not to swallow the solution.  You can also use throat lozenges/cough drops and Chloraseptic spray to help with throat pain or discomfort.  Warm or cold liquids can also be helpful in relieving throat pain.  For headache, pain or general discomfort, you can use Ibuprofen or Tylenol as directed.  Some authorities believe that zinc sprays or the use of Echinacea may shorten the course of your symptoms.   HOME CARE Only take medications as instructed by your medical team. Be sure to drink plenty of fluids. Water is fine as well as fruit juices, sodas and electrolyte beverages. You may want to stay away from caffeine or alcohol. If you are nauseated, try taking small sips of liquids. How do you know if you are getting enough fluid? Your urine should be a pale yellow or almost colorless. Get rest. Taking a steamy shower or using a humidifier may help nasal congestion and ease sore throat pain. You can  place a towel over your head and breathe in the steam from hot water coming from a faucet. Using a saline nasal spray works much the same way. Cough drops, hard candies and sore throat lozenges may ease your cough. Avoid close contacts especially the very young and the elderly Cover your mouth if you cough or sneeze Always remember to wash your hands.   GET HELP RIGHT AWAY IF: You develop worsening fever. If your symptoms do not improve within 10 days You develop yellow or green discharge from your nose over 3 days. You have coughing fits You develop a severe head ache or visual changes. You develop shortness of breath, difficulty breathing or start having chest pain Your symptoms persist after you have completed your treatment plan  MAKE SURE YOU  Understand these instructions. Will watch your condition. Will get help right away if you are not doing well or get worse.  Thank you for choosing an e-visit.  Your e-visit answers were reviewed by a board certified advanced clinical practitioner to complete your personal care plan. Depending upon the condition, your plan could have included both over the counter or prescription medications.  Please review your pharmacy choice. Make sure the pharmacy is open so you can pick up prescription now. If there is a problem, you may contact your provider through [employer] and have the prescription routed to another pharmacy.  Your safety is important to [place]. If you have drug allergies check your prescription carefully.   For the next 24 hours you can use MyChart to ask questions about today's visit, request a non-urgent call back, or ask for a work or school excuse. You will get an email in the next two days asking about your experience. I hope that your e-visit has been valuable and will speed your recovery.    I have spent 5 minutes in review of e-visit questionnaire, review and updating patient chart, medical decision making and response  to patient.   [place], [place]

## 2023-06-18 ENCOUNTER — Other Ambulatory Visit: Payer: Self-pay | Admitting: Obstetrics and Gynecology

## 2023-07-23 ENCOUNTER — Encounter (HOSPITAL_COMMUNITY): Payer: Self-pay | Admitting: Obstetrics and Gynecology

## 2023-07-24 ENCOUNTER — Encounter (HOSPITAL_COMMUNITY): Payer: Self-pay | Admitting: Obstetrics and Gynecology

## 2023-07-24 NOTE — Progress Notes (Signed)
 Spoke w/ via phone for pre-op interview--- pt Lab needs dos----  urine preg       Lab results------ lab appt 07-26-2023 @ 1300 getting CBC/ BMP/ T&S/ EKG COVID test -----patient states asymptomatic no test needed Arrive at -------  0930 on 07-27-2023 NPO after MN NO Solid Food.  Clear liquids from MN until--- 0830 Pre-Surgery Ensure or G2: n/a ERAS protocol:  yes  Med rec completed Medications to take morning of surgery -----  none Diabetic medication ----- n/a  GLP1 agonist last dose: n/a GLP1 instructions:  Patient instructed no nail polish to be worn day of surgery Patient instructed to bring photo id and insurance card day of surgery Patient aware to have Driver (ride ) / caregiver    for 24 hours after surgery - mother, valerie Jasinski Patient Special Instructions ----- will pick up bag w/ soap and written instructions at lab appt Pre-Op special Instructions ----- n/a  Patient verbalized understanding of instructions that were given at this phone interview. Patient denies chest pain, sob, fever, cough at the interview.

## 2023-07-24 NOTE — Pre-Procedure Instructions (Signed)
 Surgical Instructions   Your procedure is scheduled on :  Friday,  07-27-2023. Report to [facility] Main Entrance "A" at 9:30  A.M., then check in with the Admitting office. Any questions or running late day of surgery: call [phone]  Questions prior to your surgery date: call [phone], Monday-Friday, 8am-4pm. If you experience any cold or flu symptoms such as cough, fever, chills, shortness of breath, etc. between now and your scheduled surgery, please notify your surgeon office.    Remember:  Do not eat any food after midnight the night before your surgery.  You may have clear liquids from midnight night before surgery until 8:30 AM.   You may drink clear liquids until 8:30 AM the morning of your surgery.   Clear liquids allowed are:  Water  Carbonated Beverages Clear Tea (may sweeten, no milk, honey, etc.) Black Coffee Only (may sweeten, NO MILK, CREAM OR POWDERED CREAMER of any kind) Sport drinks, like Gatorade NO clear liquids after 8:30 AM day of surgery.  This includes no water ,  candy,  gum,  and  mints.    Take these medicines the morning of surgery with A SIP OF WATER  :  none   May take these medicines IF NEEDED: none    One week prior to surgery, STOP taking any Aspirin (unless otherwise instructed by your surgeon) Aleve, Naproxen, Ibuprofen , Motrin , Advil , Goody's, BC's, all herbal medications, fish oil, and non-prescription vitamins.                     Do NOT Smoke (Tobacco/Vaping) and Do NOT drink alcohol for 24 hours prior to your procedure.    You will be asked to remove any contacts, glasses, piercing's, hearing aid's, dentures/partials prior to surgery. Please bring cases for these items if needed.    Patients discharged the day of surgery will not be allowed to drive home, and someone needs to stay with them for 24 hours.  SURGICAL WAITING ROOM VISITATION Patients may have no more than 2 support people in the waiting area - these visitors may  rotate.   Pre-op nurse will coordinate an appropriate time for 1 ADULT support person, who may not rotate, to accompany patient in pre-op.  Children under the age of 77 must have an adult with them who is not the patient and must remain in the main waiting area with an adult.  If the patient needs to stay at the hospital during part of their recovery, the visitor guidelines for inpatient rooms apply.  Please refer to the [facility] website for the visitor guidelines for any additional information.   If you received a COVID test during your pre-op visit  it is requested that you wear a mask when out in public, stay away from anyone that may not be feeling well and notify your surgeon if you develop symptoms. If you have been in contact with anyone that has tested positive in the last 10 days please notify you surgeon.      Pre-operative CHG Bathing Instructions   You can play a key role in reducing the risk of infection after surgery. Your skin needs to be as free of germs as possible. You can reduce the number of germs on your skin by washing with CHG (chlorhexidine  gluconate) soap before surgery. CHG is an antiseptic soap that kills germs and continues to kill germs even after washing.   DO NOT use if you have an allergy to chlorhexidine /CHG or antibacterial soaps. If your  skin becomes reddened or irritated, stop using the CHG and notify Pre-Op nurse day of surgery.  Please get dial soap or other antibacterial soap and shower following the instructions below.             TAKE A SHOWER THE NIGHT BEFORE SURGERY AND THE DAY OF SURGERY    Please keep in mind the following:  DO NOT shave, including legs and underarms, 48 hours prior to surgery.   You may shave your face before/day of surgery.  Place clean sheets on your bed the night before surgery Use a clean washcloth (not used since being washed) for each shower. DO NOT sleep with pet's night before surgery.  CHG Shower Instructions:   Wash your face and private area with normal soap. If you choose to wash your hair, wash first with your normal shampoo.  After you use shampoo/soap, rinse your hair and body thoroughly to remove shampoo/soap residue.  Turn the water  OFF and apply half the bottle of CHG soap to a CLEAN washcloth.  Apply CHG soap ONLY FROM YOUR NECK DOWN TO YOUR TOES (washing for 3-5 minutes)  DO NOT use CHG soap on face, private areas, open wounds, or sores.  Pay special attention to the area where your surgery is being performed.  If you are having back surgery, having someone wash your back for you may be helpful. Wait 2 minutes after CHG soap is applied, then you may rinse off the CHG soap.  Pat dry with a clean towel  Put on clean pajamas    Additional instructions for the day of surgery: DO NOT APPLY any lotions, oils, deodorants (may use underarm deodorant) , cologne  /  perfumes  or makeup  Do not wear jewelry /  piercing's/  metal/  permanent jewelry must be removed prior to arrival day of surgery  (no plastic piercing's) Do not wear nail polish, gel polish, artificial nails, or any other type of covering on natural finger nails (toe nails are okay) Do not bring valuables to the hospital. [facility] is not responsible for valuables/personal belongings. Put on clean/comfortable clothes.  Please brush your teeth.  Ask your nurse before applying any prescription medications to the skin.

## 2023-07-26 ENCOUNTER — Encounter (HOSPITAL_COMMUNITY)
Admission: RE | Admit: 2023-07-26 | Discharge: 2023-07-26 | Disposition: A | Discharge status: Home / Self Care | Source: Ambulatory Visit | Attending: Obstetrics and Gynecology | Admitting: Obstetrics and Gynecology

## 2023-07-26 DIAGNOSIS — Z01818 Encounter for other preprocedural examination: Secondary | ICD-10-CM | POA: Insufficient documentation

## 2023-07-26 LAB — CBC
HCT: 41.5 % (ref 36.0–46.0)
Hemoglobin: 12.3 g/dL (ref 12.0–15.0)
MCH: 23.6 pg — ABNORMAL LOW (ref 26.0–34.0)
MCHC: 29.6 g/dL — ABNORMAL LOW (ref 30.0–36.0)
MCV: 79.7 fL — ABNORMAL LOW (ref 80.0–100.0)
Platelets: 497 10*3/uL — ABNORMAL HIGH (ref 150–400)
RBC: 5.21 MIL/uL — ABNORMAL HIGH (ref 3.87–5.11)
RDW: 20.6 % — ABNORMAL HIGH (ref 11.5–15.5)
WBC: 9.5 10*3/uL (ref 4.0–10.5)
nRBC: 0 % (ref 0.0–0.2)

## 2023-07-26 LAB — BASIC METABOLIC PANEL WITH GFR
Anion gap: 8 (ref 5–15)
BUN: 11 mg/dL (ref 6–20)
CO2: 24 mmol/L (ref 22–32)
Calcium: 9.4 mg/dL (ref 8.9–10.3)
Chloride: 106 mmol/L (ref 98–111)
Creatinine, Ser: 0.81 mg/dL (ref 0.44–1.00)
GFR, Estimated: >60 mL/min (ref >60)
Glucose, Bld: 108 mg/dL — ABNORMAL HIGH (ref 70–99)
Potassium: 3.8 mmol/L (ref 3.5–5.1)
Sodium: 138 mmol/L (ref 135–145)

## 2023-07-26 LAB — TYPE AND SCREEN
ABO/RH(D): O POS
Antibody Screen: NEGATIVE

## 2023-07-27 ENCOUNTER — Ambulatory Visit (HOSPITAL_BASED_OUTPATIENT_CLINIC_OR_DEPARTMENT_OTHER): Admitting: Anesthesiology

## 2023-07-27 ENCOUNTER — Other Ambulatory Visit: Payer: Self-pay

## 2023-07-27 ENCOUNTER — Ambulatory Visit (HOSPITAL_COMMUNITY)
Admission: RE | Admit: 2023-07-27 | Discharge: 2023-07-28 | Disposition: A | Discharge status: Home / Self Care | Attending: Obstetrics and Gynecology | Admitting: Obstetrics and Gynecology

## 2023-07-27 ENCOUNTER — Encounter (HOSPITAL_COMMUNITY): Payer: Self-pay | Admitting: Obstetrics and Gynecology

## 2023-07-27 ENCOUNTER — Encounter (HOSPITAL_COMMUNITY)
Admission: RE | Disposition: A | Discharge status: Home / Self Care | Payer: Self-pay | Source: Home / Self Care | Attending: Obstetrics and Gynecology

## 2023-07-27 ENCOUNTER — Ambulatory Visit (HOSPITAL_COMMUNITY): Admitting: Anesthesiology

## 2023-07-27 DIAGNOSIS — N736 Female pelvic peritoneal adhesions (postinfective): Secondary | ICD-10-CM

## 2023-07-27 DIAGNOSIS — D259 Leiomyoma of uterus, unspecified: Secondary | ICD-10-CM

## 2023-07-27 DIAGNOSIS — N920 Excessive and frequent menstruation with regular cycle: Secondary | ICD-10-CM | POA: Diagnosis present

## 2023-07-27 DIAGNOSIS — Z90710 Acquired absence of both cervix and uterus: Secondary | ICD-10-CM | POA: Diagnosis present

## 2023-07-27 DIAGNOSIS — Z01818 Encounter for other preprocedural examination: Secondary | ICD-10-CM

## 2023-07-27 DIAGNOSIS — N83201 Unspecified ovarian cyst, right side: Secondary | ICD-10-CM

## 2023-07-27 HISTORY — DX: Leiomyoma of uterus, unspecified: D25.9

## 2023-07-27 HISTORY — DX: Other specified anxiety disorders: F41.8

## 2023-07-27 HISTORY — PX: ROBOTIC ASSISTED TOTAL HYSTERECTOMY: SHX6085

## 2023-07-27 HISTORY — DX: Excessive and frequent menstruation with regular cycle: N92.0

## 2023-07-27 LAB — POCT PREGNANCY, URINE: Preg Test, Ur: NEGATIVE

## 2023-07-27 SURGERY — HYSTERECTOMY, TOTAL, ROBOT-ASSISTED
Anesthesia: General | Site: Abdomen

## 2023-07-27 MED ORDER — PROPOFOL 10 MG/ML IV BOLUS
INTRAVENOUS | Status: AC
  Filled 2023-07-27: qty 20

## 2023-07-27 MED ORDER — PANTOPRAZOLE SODIUM 40 MG PO TBEC
40.0000 mg | DELAYED_RELEASE_TABLET | Freq: Every day | ORAL | Status: DC
  Administered 2023-07-27: 40 mg via ORAL
  Filled 2023-07-27: qty 1

## 2023-07-27 MED ORDER — PHENYLEPHRINE 80 MCG/ML (10ML) SYRINGE FOR IV PUSH (FOR BLOOD PRESSURE SUPPORT)
PREFILLED_SYRINGE | INTRAVENOUS | Status: AC
  Filled 2023-07-27: qty 10

## 2023-07-27 MED ORDER — KETAMINE HCL 50 MG/5ML IJ SOSY
PREFILLED_SYRINGE | INTRAMUSCULAR | Status: AC
  Filled 2023-07-27: qty 5

## 2023-07-27 MED ORDER — ACETAMINOPHEN 500 MG PO TABS
1000.0000 mg | ORAL_TABLET | Freq: Four times a day (QID) | ORAL | Status: DC
  Administered 2023-07-27 – 2023-07-28 (×2): 1000 mg via ORAL
  Filled 2023-07-27 (×2): qty 2

## 2023-07-27 MED ORDER — OXYCODONE HCL 5 MG PO TABS
5.0000 mg | ORAL_TABLET | ORAL | Status: DC | PRN
  Administered 2023-07-27 – 2023-07-28 (×3): 10 mg via ORAL
  Filled 2023-07-27 (×3): qty 2

## 2023-07-27 MED ORDER — ALBUMIN HUMAN 5 % IV SOLN: INTRAVENOUS | Status: DC | PRN

## 2023-07-27 MED ORDER — ONDANSETRON HCL 4 MG/2ML IJ SOLN
INTRAMUSCULAR | Status: DC | PRN
  Administered 2023-07-27: 4 mg via INTRAVENOUS

## 2023-07-27 MED ORDER — KETOROLAC TROMETHAMINE 30 MG/ML IJ SOLN
INTRAMUSCULAR | Status: DC | PRN
  Administered 2023-07-27: 30 mg via INTRAVENOUS

## 2023-07-27 MED ORDER — DEXMEDETOMIDINE HCL IN NACL 80 MCG/20ML IV SOLN
INTRAVENOUS | Status: DC | PRN
  Administered 2023-07-27: 8 ug via INTRAVENOUS

## 2023-07-27 MED ORDER — OXYCODONE HCL 5 MG PO TABS: 5.0000 mg | ORAL_TABLET | Freq: Once | ORAL | Status: DC | PRN

## 2023-07-27 MED ORDER — SUGAMMADEX SODIUM 200 MG/2ML IV SOLN
INTRAVENOUS | Status: DC | PRN
  Administered 2023-07-27: 200 mg via INTRAVENOUS

## 2023-07-27 MED ORDER — FENTANYL CITRATE (PF) 100 MCG/2ML IJ SOLN
INTRAMUSCULAR | Status: AC
  Administered 2023-07-27: 25 ug via INTRAVENOUS
  Filled 2023-07-27: qty 2

## 2023-07-27 MED ORDER — DEXTROSE 5 % IV SOLN
5.0000 mg/kg | INTRAVENOUS | Status: AC
  Administered 2023-07-27: 310 mg via INTRAVENOUS
  Filled 2023-07-27: qty 7.75

## 2023-07-27 MED ORDER — SODIUM CHLORIDE 0.9 % IR SOLN
Status: DC | PRN
  Administered 2023-07-27: 1000 mL

## 2023-07-27 MED ORDER — ACETAMINOPHEN 500 MG PO TABS
ORAL_TABLET | ORAL | Status: AC
  Filled 2023-07-27: qty 2

## 2023-07-27 MED ORDER — MIDAZOLAM HCL 2 MG/2ML IJ SOLN
INTRAMUSCULAR | Status: AC
  Filled 2023-07-27: qty 2

## 2023-07-27 MED ORDER — ROCURONIUM BROMIDE 10 MG/ML (PF) SYRINGE
PREFILLED_SYRINGE | INTRAVENOUS | Status: DC | PRN
  Administered 2023-07-27: 100 mg via INTRAVENOUS
  Administered 2023-07-27 (×4): 10 mg via INTRAVENOUS

## 2023-07-27 MED ORDER — ORAL CARE MOUTH RINSE: 15.0000 mL | Freq: Once | OROMUCOSAL | Status: AC

## 2023-07-27 MED ORDER — SIMETHICONE 80 MG PO CHEW: 80.0000 mg | CHEWABLE_TABLET | Freq: Four times a day (QID) | ORAL | Status: DC | PRN

## 2023-07-27 MED ORDER — SCOPOLAMINE 1 MG/3DAYS TD PT72
MEDICATED_PATCH | TRANSDERMAL | Status: AC
  Filled 2023-07-27: qty 1

## 2023-07-27 MED ORDER — SCOPOLAMINE 1 MG/3DAYS TD PT72
1.0000 | MEDICATED_PATCH | TRANSDERMAL | Status: DC
  Administered 2023-07-27: 1.5 mg via TRANSDERMAL

## 2023-07-27 MED ORDER — BUPIVACAINE HCL (PF) 0.25 % IJ SOLN
INTRAMUSCULAR | Status: DC | PRN
  Administered 2023-07-27: 17 mL

## 2023-07-27 MED ORDER — ROCURONIUM BROMIDE 10 MG/ML (PF) SYRINGE
PREFILLED_SYRINGE | INTRAVENOUS | Status: AC
  Filled 2023-07-27: qty 20

## 2023-07-27 MED ORDER — LACTATED RINGERS IV SOLN: INTRAVENOUS | Status: DC

## 2023-07-27 MED ORDER — ONDANSETRON HCL 4 MG/2ML IJ SOLN: 4.0000 mg | Freq: Four times a day (QID) | INTRAMUSCULAR | Status: DC | PRN

## 2023-07-27 MED ORDER — FENTANYL CITRATE (PF) 100 MCG/2ML IJ SOLN
INTRAMUSCULAR | Status: DC | PRN
Start: 2023-07-27 — End: 2023-07-27
  Administered 2023-07-27 (×2): 50 ug via INTRAVENOUS
  Administered 2023-07-27: 100 ug via INTRAVENOUS
  Administered 2023-07-27: 50 ug via INTRAVENOUS

## 2023-07-27 MED ORDER — ROCURONIUM BROMIDE 10 MG/ML (PF) SYRINGE
PREFILLED_SYRINGE | INTRAVENOUS | Status: AC
  Filled 2023-07-27: qty 10

## 2023-07-27 MED ORDER — DEXTROSE IN LACTATED RINGERS 5 % IV SOLN: INTRAVENOUS | Status: AC

## 2023-07-27 MED ORDER — WHITE PETROLATUM EX OINT
TOPICAL_OINTMENT | CUTANEOUS | Status: AC
  Filled 2023-07-27: qty 28.35

## 2023-07-27 MED ORDER — LIDOCAINE 2% (20 MG/ML) 5 ML SYRINGE
INTRAMUSCULAR | Status: AC
  Filled 2023-07-27: qty 5

## 2023-07-27 MED ORDER — MIDAZOLAM HCL 5 MG/5ML IJ SOLN
INTRAMUSCULAR | Status: DC | PRN
Start: 2023-07-27 — End: 2023-07-27
  Administered 2023-07-27: 2 mg via INTRAVENOUS

## 2023-07-27 MED ORDER — FENTANYL CITRATE (PF) 250 MCG/5ML IJ SOLN
INTRAMUSCULAR | Status: AC
  Filled 2023-07-27: qty 5

## 2023-07-27 MED ORDER — ONDANSETRON HCL 4 MG PO TABS: 4.0000 mg | ORAL_TABLET | Freq: Four times a day (QID) | ORAL | Status: DC | PRN

## 2023-07-27 MED ORDER — DEXAMETHASONE SODIUM PHOSPHATE 10 MG/ML IJ SOLN
INTRAMUSCULAR | Status: DC | PRN
Start: 2023-07-27 — End: 2023-07-27
  Administered 2023-07-27: 10 mg via INTRAVENOUS

## 2023-07-27 MED ORDER — CLINDAMYCIN PHOSPHATE 900 MG/50ML IV SOLN
900.0000 mg | INTRAVENOUS | Status: AC
  Administered 2023-07-27: 900 mg via INTRAVENOUS

## 2023-07-27 MED ORDER — PROPOFOL 10 MG/ML IV BOLUS
INTRAVENOUS | Status: DC | PRN
Start: 2023-07-27 — End: 2023-07-27
  Administered 2023-07-27: 200 mg via INTRAVENOUS

## 2023-07-27 MED ORDER — CLINDAMYCIN PHOSPHATE 900 MG/50ML IV SOLN
INTRAVENOUS | Status: AC
  Filled 2023-07-27: qty 50

## 2023-07-27 MED ORDER — LIDOCAINE 2% (20 MG/ML) 5 ML SYRINGE
INTRAMUSCULAR | Status: DC | PRN
Start: 2023-07-27 — End: 2023-07-27
  Administered 2023-07-27: 80 mg via INTRAVENOUS

## 2023-07-27 MED ORDER — FENTANYL CITRATE (PF) 100 MCG/2ML IJ SOLN
25.0000 ug | INTRAMUSCULAR | Status: DC | PRN
  Administered 2023-07-27 (×2): 25 ug via INTRAVENOUS

## 2023-07-27 MED ORDER — PHENYLEPHRINE 80 MCG/ML (10ML) SYRINGE FOR IV PUSH (FOR BLOOD PRESSURE SUPPORT)
PREFILLED_SYRINGE | INTRAVENOUS | Status: DC | PRN
  Administered 2023-07-27 (×5): 80 ug via INTRAVENOUS

## 2023-07-27 MED ORDER — POVIDONE-IODINE 10 % EX SWAB
2.0000 | Freq: Once | CUTANEOUS | Status: DC
Start: 2023-07-27 — End: 2023-07-27

## 2023-07-27 MED ORDER — ACETAMINOPHEN 500 MG PO TABS
1000.0000 mg | ORAL_TABLET | Freq: Once | ORAL | Status: AC
  Administered 2023-07-27: 1000 mg via ORAL

## 2023-07-27 MED ORDER — KETAMINE HCL 10 MG/ML IJ SOLN
INTRAMUSCULAR | Status: DC | PRN
  Administered 2023-07-27: 20 mg via INTRAVENOUS
  Administered 2023-07-27: 15 mg via INTRAVENOUS

## 2023-07-27 MED ORDER — MENTHOL 3 MG MT LOZG: 1.0000 | LOZENGE | OROMUCOSAL | Status: DC | PRN

## 2023-07-27 MED ORDER — CHLORHEXIDINE GLUCONATE 0.12 % MT SOLN
15.0000 mL | Freq: Once | OROMUCOSAL | Status: AC
  Administered 2023-07-27: 15 mL via OROMUCOSAL

## 2023-07-27 MED ORDER — IBUPROFEN 200 MG PO TABS
600.0000 mg | ORAL_TABLET | Freq: Four times a day (QID) | ORAL | Status: DC
  Administered 2023-07-27 – 2023-07-28 (×2): 600 mg via ORAL
  Filled 2023-07-27 (×2): qty 3

## 2023-07-27 MED ORDER — CHLORHEXIDINE GLUCONATE 0.12 % MT SOLN
OROMUCOSAL | Status: AC
  Filled 2023-07-27: qty 15

## 2023-07-27 MED ORDER — OXYCODONE HCL 5 MG/5ML PO SOLN: 5.0000 mg | Freq: Once | ORAL | Status: DC | PRN

## 2023-07-27 MED ORDER — DROPERIDOL 2.5 MG/ML IJ SOLN: 0.6250 mg | Freq: Once | INTRAMUSCULAR | Status: DC | PRN

## 2023-07-27 SURGICAL SUPPLY — 48 items
APPLICATOR ARISTA FLEXITIP XL (MISCELLANEOUS) IMPLANT
CATH FOLEY 3WAY 5CC 16FR (CATHETERS) ×1 IMPLANT
CAUTERY HOOK MNPLR 1.6 DVNC XI (INSTRUMENTS) IMPLANT
COVER BACK TABLE 60X90IN (DRAPES) ×1 IMPLANT
COVER TIP SHEARS 8 DVNC (MISCELLANEOUS) ×1 IMPLANT
DEFOGGER SCOPE WARMER CLEARIFY (MISCELLANEOUS) ×1 IMPLANT
DERMABOND ADVANCED .7 DNX12 (GAUZE/BANDAGES/DRESSINGS) ×1 IMPLANT
DRAPE ARM DVNC X/XI (DISPOSABLE) ×4 IMPLANT
DRAPE COLUMN DVNC XI (DISPOSABLE) ×1 IMPLANT
DRAPE SURG IRRIG POUCH 19X23 (DRAPES) ×1 IMPLANT
DRAPE UTILITY XL STRL (DRAPES) ×1 IMPLANT
DRIVER NDL MEGA SUTCUT DVNCXI (INSTRUMENTS) ×1 IMPLANT
DRIVER NDLE MEGA SUTCUT DVNCXI (INSTRUMENTS) ×1 IMPLANT
DURAPREP 26ML APPLICATOR (WOUND CARE) ×1 IMPLANT
ELECTRODE REM PT RTRN 9FT ADLT (ELECTROSURGICAL) ×1 IMPLANT
FORCEPS BPLR LNG DVNC XI (INSTRUMENTS) ×1 IMPLANT
FORCEPS LONG TIP 8 DVNC XI (FORCEP) ×1 IMPLANT
FORCEPS TENACULUM DVNC XI (FORCEP) IMPLANT
GLOVE BIOGEL PI IND STRL 7.0 (GLOVE) ×5 IMPLANT
GLOVE ECLIPSE 6.5 STRL STRAW (GLOVE) ×3 IMPLANT
HEMOSTAT ARISTA ABSORB 3G PWDR (HEMOSTASIS) IMPLANT
HIBICLENS CHG 4% 4OZ BTL (MISCELLANEOUS) ×1 IMPLANT
IRRIGATION STRYKERFLOW (MISCELLANEOUS) ×1 IMPLANT
KIT PINK PAD W/HEAD ARE REST (MISCELLANEOUS) ×1 IMPLANT
KIT PINK PAD W/HEAD ARM REST (MISCELLANEOUS) ×2 IMPLANT
LEGGING LITHOTOMY PAIR STRL (DRAPES) ×1 IMPLANT
MANIPULATOR ADVINCU DEL 3.0 PL (MISCELLANEOUS) IMPLANT
NDL INSUFFLATION 14GA 120MM (NEEDLE) ×1 IMPLANT
NEEDLE INSUFFLATION 14GA 120MM (NEEDLE) ×1 IMPLANT
OBTURATOR OPTICALSTD 8 DVNC (TROCAR) ×1 IMPLANT
OCCLUDER COLPOPNEUMO (BALLOONS) ×1 IMPLANT
PACK ROBOT WH (CUSTOM PROCEDURE TRAY) ×1 IMPLANT
PACK ROBOTIC GOWN (GOWN DISPOSABLE) ×1 IMPLANT
PAD OB MATERNITY 11 LF (PERSONAL CARE ITEMS) ×1 IMPLANT
SCISSORS MNPLR CVD DVNC XI (INSTRUMENTS) ×1 IMPLANT
SEAL UNIV 5-12 XI (MISCELLANEOUS) ×4 IMPLANT
SEALER VESSEL EXT DVNC XI (MISCELLANEOUS) ×1 IMPLANT
SET IRRIG Y TYPE TUR BLADDER L (SET/KITS/TRAYS/PACK) IMPLANT
SET TRI-LUMEN FLTR TB AIRSEAL (TUBING) ×1 IMPLANT
SUT VIC AB 0 CT1 27XBRD ANBCTR (SUTURE) IMPLANT
SUT VIC AB 0 CT1 36 (SUTURE) ×2 IMPLANT
SUT VIC AB 0 CT2 27 (SUTURE) IMPLANT
SUT VICRYL 4-0 PS2 18IN ABS (SUTURE) ×2 IMPLANT
SUT VLOC 180 0 9IN GS21 (SUTURE) ×1 IMPLANT
TIP UTERINE 6.7X10CM GRN DISP (MISCELLANEOUS) IMPLANT
TOWEL GREEN STERILE (TOWEL DISPOSABLE) ×1 IMPLANT
TROCAR PORT AIRSEAL 8X120 (TROCAR) ×1 IMPLANT
UNDERPAD 30X36 HEAVY ABSORB (UNDERPADS AND DIAPERS) ×1 IMPLANT

## 2023-07-27 NOTE — Brief Op Note (Signed)
 07/27/2023  3:56 PM  PATIENT:  Katherine Ingram  43 y.o. female  PRE-OPERATIVE DIAGNOSIS:  Menorrhagia, Uterine fibroid  POST-OPERATIVE DIAGNOSIS:  Menorrhagia, Uterine fibroid, pelvic adhesions  PROCEDURE:  Da Vinci robotic total hysterectomy, bilateral salpingectomy, cystoscopy  SURGEON:  Surgeons and Role:    Ivery Marking, MD - Primary  PHYSICIAN ASSISTANT:   ASSISTANTS: Lori Rondo RNFA   ANESTHESIA:   general FINDINGS;  NL appendix, right ovary with cyst, left ovary plastered to posterior uterine wall, distorted bilateral tubes, fibroid uterus, serosal surface fluid filled adhesions, nl liver edge EBL:  100 mL   BLOOD ADMINISTERED:none  DRAINS: none   LOCAL MEDICATIONS USED:  MARCAINE      SPECIMEN:  Source of Specimen:  uterus with cervix, tubes  DISPOSITION OF SPECIMEN:  PATHOLOGY  COUNTS:  YES  TOURNIQUET:  * No tourniquets in log *  DICTATION: .Other Dictation: Dictation Number [id number]  PLAN OF CARE: Admit for overnight observation  PATIENT DISPOSITION:  PACU - hemodynamically stable.   Delay start of Pharmacological VTE agent (>24hrs) due to surgical blood loss or risk of bleeding: no

## 2023-07-27 NOTE — H&P (Signed)
 Katherine Ingram is an 43 y.o. female. G0 BF presents for surgical management of symptomatic uterine fibroids. Hx of myomectomy as well as transcervical ablation of uterine fibroids using Sonata , pt declined [place]. Requested definitive surgical mgmt  Pertinent Gynecological History: Menses:  menorrhagia Bleeding: menorrhagia Contraception: none DES exposure: denies Blood transfusions: none Sexually transmitted diseases: no past history Previous GYN Procedures:  myomectomy   Last mammogram: normal Date: 2024 Last pap: normal Date: 2025 OB History: G0, P0   Menstrual History: Menarche age: n/a  No LMP recorded. (Menstrual status: Irregular Periods).    Past Medical History:  Diagnosis Date   Anemia    history blood transfusion w/ abdominal myomectomy surgery 10/ 2018 d/t Hg 5.1   Fibroid, uterine    Hx of abnormal cervical Pap smear 2014   HSIL   Hypertension    (07-24-2023  pt stated stop taking medication (nifedipine 30 mg) shortly after surgery 07-01-2021 ,  stated does monitor BP at home, last week bp 127/85)   pt has history HTN with intermittantly taking medication prior to taking mediction 03/ 2023 last taken was 2019;  Patient started on meds for hypertension on 06/27/21  prior to surgery on 07-01-2021   Menorrhagia    Situational anxiety    Wears glasses     Past Surgical History:  Procedure Laterality Date   CERVICAL BIOPSY  W/ LOOP ELECTRODE EXCISION  2014   for HSIL   DILATATION & CURETTAGE/HYSTEROSCOPY WITH MYOSURE N/A 06/01/2020   Procedure: DILATATION & CURETTAGE/HYSTEROSCOPY WITH MYOSURE;  Surgeon: Verlyn Goad, MD;  Location: [facility];  Service: Gynecology;  Laterality: N/A;   DILATATION & CURETTAGE/HYSTEROSCOPY WITH MYOSURE N/A 07/01/2021   Procedure: DILATATION & CURETTAGE/HYSTEROSCOPY WITH MYOSURE;  Surgeon: Ivery Marking, MD;  Location[facility];  Service: Gynecology;  Laterality: N/A;   MYOMECTOMY N/A 01/09/2017    Procedure: ABDOMINAL MYOMECTOMY;  Surgeon: Verlyn Goad, MD;  Location: [facility];  Service: Gynecology;  Laterality: N/A;   ORIF ANKLE FRACTURE Left 1994   per pt has hardware    Family History  Problem Relation Age of Onset   Diabetes Father    Scoliosis Sister     Social History:  reports that she has never smoked. She has never used smokeless tobacco. She reports that she does not drink alcohol and does not use drugs.  Allergies:  Allergies  Allergen Reactions   Penicillins Rash    Medications Prior to Admission  Medication Sig Dispense Refill Last Dose/Taking   Metamucil Fiber 2 g CHEW Chew 2 each by mouth daily. gummies   Past Week   Multiple Vitamins-Minerals (MULTIVITAMIN GUMMIES ADULTS PO) Take 2 each by mouth daily.   07/26/2023    Review of Systems  All other systems reviewed and are negative.   Blood pressure (!) 139/96, pulse 97, temperature 98.2 F (36.8 C), temperature source Oral, resp. rate 16, height 5\' 5"  (1.651 m), weight 70.3 kg, SpO2 100%. Physical Exam Constitutional:      Appearance: Normal appearance.  HENT:     Head: Atraumatic.  Eyes:     Extraocular Movements: Extraocular movements intact.  Cardiovascular:     Rate and Rhythm: Regular rhythm.     Heart sounds: Normal heart sounds.  Pulmonary:     Breath sounds: Normal breath sounds.  Abdominal:     Comments: Soft non distended uterus 3 FB above umb  Genitourinary:    General: Normal vulva.     Comments: Cervix closed  Uterus 14 wk irreg Adnexa non palpable Musculoskeletal:        General: Normal range of motion.     Cervical back: Neck supple.  Skin:    General: Skin is warm and dry.  Neurological:     General: No focal deficit present.     Mental Status: She is alert and oriented to person, place, and time.  Psychiatric:        Mood and Affect: Mood normal.        Behavior: Behavior normal.     Results for orders placed or performed during the hospital encounter of 07/27/23  (from the past 24 hours)  Pregnancy, urine POC     Status: None   Collection Time: 07/27/23  9:48 AM  Result Value Ref Range   Preg Test, Ur NEGATIVE NEGATIVE    No results found.  Assessment/Plan: Symptomatic uterine fibroids P) daVinci robotic total hysterectomy, bilateral salpingectomy. Procedure explained. Risk of surgery reviewed including infection, bleeding, injury to bladder, bowel, ureter, internal scar tissue, thermal injury, possible need for blood transfusion and its risk,  possible need for ovarian surgery due to disease processes, possible need for open routed  [facility] A Rahima Fleishman 07/27/2023, 10:18 AM

## 2023-07-27 NOTE — Anesthesia Preprocedure Evaluation (Signed)
 Anesthesia Evaluation  Patient identified by MRN, date of birth, ID band Patient awake    Reviewed: Allergy & Precautions, H&P , NPO status , Patient's Chart, lab work & pertinent test results  Airway Mallampati: II  TM Distance: >3 FB Neck ROM: Full    Dental  (+) Teeth Intact, Dental Advisory Given   Pulmonary neg pulmonary ROS   Pulmonary exam normal breath sounds clear to auscultation       Cardiovascular hypertension (139/96 preop, no meds), Normal cardiovascular exam Rhythm:Regular Rate:Normal     Neuro/Psych  PSYCHIATRIC DISORDERS Anxiety     negative neurological ROS     GI/Hepatic negative GI ROS, Neg liver ROS,,,  Endo/Other  negative endocrine ROS    Renal/GU negative Renal ROS  negative genitourinary   Musculoskeletal negative musculoskeletal ROS (+)    Abdominal   Peds negative pediatric ROS (+)  Hematology  (+) Blood dyscrasia, anemia Hb 12.3, plt 497   Anesthesia Other Findings   Reproductive/Obstetrics negative OB ROS                             Anesthesia Physical Anesthesia Plan  ASA: 1  Anesthesia Plan: General   Post-op Pain Management: Tylenol  PO (pre-op)*, Toradol  IV (intra-op)*, Ketamine  IV* and Dilaudid  IV   Induction: Intravenous  PONV Risk Score and Plan: Ondansetron , Dexamethasone , Midazolam , Treatment may vary due to age or medical condition and Scopolamine  patch - Pre-op  Airway Management Planned: Oral ETT  Additional Equipment: None  Intra-op Plan:   Post-operative Plan: Extubation in OR  Informed Consent: I have reviewed the patients History and Physical, chart, labs and discussed the procedure including the risks, benefits and alternatives for the proposed anesthesia with the patient or authorized representative who has indicated his/her understanding and acceptance.     Dental advisory given  Plan Discussed with: CRNA  Anesthesia Plan  Comments:        Anesthesia Quick Evaluation

## 2023-07-27 NOTE — Anesthesia Procedure Notes (Addendum)
 Procedure Name: Intubation Date/Time: 07/27/2023 11:20 AM  Performed by: Ezzie Holstein, CRNAPre-anesthesia Checklist: Patient identified, Emergency Drugs available, Suction available and Patient being monitored Patient Re-evaluated:Patient Re-evaluated prior to induction Oxygen Delivery Method: Circle System Utilized Preoxygenation: Pre-oxygenation with 100% oxygen Induction Type: IV induction Ventilation: Mask ventilation without difficulty Laryngoscope Size: Mac and 3 Tube type: Oral Number of attempts: 1 Airway Equipment and Method: Stylet and Oral airway Placement Confirmation: ETT inserted through vocal cords under direct vision, positive ETCO2 and breath sounds checked- equal and bilateral Secured at: 21 cm Tube secured with: Tape Dental Injury: Teeth and Oropharynx as per pre-operative assessment  Comments: Intubation by Jacquenette Mattes, SRNA

## 2023-07-27 NOTE — Transfer of Care (Signed)
 Immediate Anesthesia Transfer of Care Note  Patient: Katherine Ingram  Procedure(s) Performed: HYSTERECTOMY, TOTAL, ROBOT-ASSISTED BILATERAL SALPINGECTOMY, CYSTOSCOPY (Abdomen)  Patient Location: PACU  Anesthesia Type:General  Level of Consciousness: awake and alert   Airway & Oxygen Therapy: Patient Spontanous Breathing and Patient connected to face mask oxygen  Post-op Assessment: Report given to RN and Post -op Vital signs reviewed and stable  Post vital signs: Reviewed and stable  Last Vitals:  Vitals Value Taken Time  BP 141/84 07/27/23 1615  Temp    Pulse 108 07/27/23 1616  Resp 20 07/27/23 1616  SpO2 100 % 07/27/23 1616  Vitals shown include unfiled device data.  Last Pain:  Vitals:   07/27/23 1012  TempSrc: Oral  PainSc: 4          Complications: No notable events documented.

## 2023-07-27 NOTE — Plan of Care (Signed)

## 2023-07-28 DIAGNOSIS — N920 Excessive and frequent menstruation with regular cycle: Secondary | ICD-10-CM | POA: Diagnosis not present

## 2023-07-28 LAB — CBC
HCT: 31.1 % — ABNORMAL LOW (ref 36.0–46.0)
Hemoglobin: 9.6 g/dL — ABNORMAL LOW (ref 12.0–15.0)
MCH: 24.4 pg — ABNORMAL LOW (ref 26.0–34.0)
MCHC: 30.9 g/dL (ref 30.0–36.0)
MCV: 78.9 fL — ABNORMAL LOW (ref 80.0–100.0)
Platelets: 368 10*3/uL (ref 150–400)
RBC: 3.94 MIL/uL (ref 3.87–5.11)
RDW: 19.6 % — ABNORMAL HIGH (ref 11.5–15.5)
WBC: 15.8 10*3/uL — ABNORMAL HIGH (ref 4.0–10.5)
nRBC: 0 % (ref 0.0–0.2)

## 2023-07-28 LAB — BASIC METABOLIC PANEL WITH GFR
Anion gap: 10 (ref 5–15)
BUN: 7 mg/dL (ref 6–20)
CO2: 23 mmol/L (ref 22–32)
Calcium: 8.9 mg/dL (ref 8.9–10.3)
Chloride: 105 mmol/L (ref 98–111)
Creatinine, Ser: 0.99 mg/dL (ref 0.44–1.00)
GFR, Estimated: >60 mL/min (ref >60)
Glucose, Bld: 146 mg/dL — ABNORMAL HIGH (ref 70–99)
Potassium: 3.8 mmol/L (ref 3.5–5.1)
Sodium: 138 mmol/L (ref 135–145)

## 2023-07-28 MED ORDER — IBUPROFEN 600 MG PO TABS: 600.0000 mg | ORAL_TABLET | Freq: Four times a day (QID) | ORAL | 0 refills | Status: AC

## 2023-07-28 MED ORDER — OXYCODONE HCL 5 MG PO TABS: 5.0000 mg | ORAL_TABLET | ORAL | 0 refills | Status: AC | PRN

## 2023-07-28 NOTE — Discharge Summary (Signed)
 Physician Discharge Summary  Patient ID: Katherine Ingram MRN: [id number] DOB/AGE: 11/15/1980 43 y.o.  Admit date: 07/27/2023 Discharge date: 07/28/2023  Admission Diagnoses:  Discharge Diagnoses:  Principal Problem:   Menorrhagia with regular cycle Active Problems:   S/P hysterectomy   Discharged Condition: {condition:18240}  Hospital Course: ***  Consults: {consultation:18241}  Significant Diagnostic Studies: {diagnostics:18242}  Treatments: {Tx:18249}  Discharge Exam: Blood pressure 112/63, pulse 89, temperature 98.2 F (36.8 C), temperature source Oral, resp. rate 18, height 5\' 5"  (1.651 m), weight 70.3 kg, SpO2 100%. {physical WUJW:1191478}  Disposition: Discharge disposition: 01-Home or Self Care       Discharge Instructions     Activity as tolerated - No restrictions   Complete by: As directed    Call MD for:  persistant dizziness or light-headedness   Complete by: As directed    Call MD for:  persistant nausea and vomiting   Complete by: As directed    Call MD for:  severe uncontrolled pain   Complete by: As directed    Call MD for:  temperature >100.4   Complete by: As directed    Diet general   Complete by: As directed       Allergies as of 07/28/2023       Reactions   Penicillins Rash        Medication List     TAKE these medications    ibuprofen  600 MG tablet Commonly known as: ADVIL  Take 1 tablet (600 mg total) by mouth every 6 (six) hours.   Metamucil Fiber 2 g Chew Chew 2 each by mouth daily. gummies   MULTIVITAMIN GUMMIES ADULTS PO Take 2 each by mouth daily.   oxyCODONE  5 MG immediate release tablet Commonly known as: Oxy IR/ROXICODONE  Take 1-2 tablets (5-10 mg total) by mouth every 4 (four) hours as needed for up to 7 days for moderate pain (pain score 4-6) or severe pain (pain score 7-10).        Follow-up Information     Ivery Marking, MD Follow up in 2 week(s).   Specialty: Obstetrics and Gynecology Contact  information: [address] [place] [address] [phone]                 Signed: Kandra Orn 07/28/2023, 7:42 AM

## 2023-07-28 NOTE — Plan of Care (Signed)
 Problem: Education: Goal: Knowledge of General Education information will improve Description: Including pain rating scale, medication(s)/side effects and non-pharmacologic comfort measures 07/28/2023 0945 by Maudie Sorrow, RN Outcome: Adequate for Discharge 07/28/2023 1610 by Maudie Sorrow, RN Outcome: Progressing   Problem: Health Behavior/Discharge Planning: Goal: Ability to manage health-related needs will improve 07/28/2023 0945 by Maudie Sorrow, RN Outcome: Adequate for Discharge 07/28/2023 9604 by Maudie Sorrow, RN Outcome: Progressing   Problem: Clinical Measurements: Goal: Ability to maintain clinical measurements within normal limits will improve 07/28/2023 0945 by Maudie Sorrow, RN Outcome: Adequate for Discharge 07/28/2023 5409 by Maudie Sorrow, RN Outcome: Progressing Goal: Will remain free from infection 07/28/2023 0945 by Maudie Sorrow, RN Outcome: Adequate for Discharge 07/28/2023 8119 by Maudie Sorrow, RN Outcome: Progressing Goal: Diagnostic test results will improve 07/28/2023 0945 by Maudie Sorrow, RN Outcome: Adequate for Discharge 07/28/2023 1478 by Maudie Sorrow, RN Outcome: Progressing Goal: Respiratory complications will improve 07/28/2023 0945 by Maudie Sorrow, RN Outcome: Adequate for Discharge 07/28/2023 2956 by Maudie Sorrow, RN Outcome: Progressing Goal: Cardiovascular complication will be avoided 07/28/2023 0945 by Maudie Sorrow, RN Outcome: Adequate for Discharge 07/28/2023 2130 by Maudie Sorrow, RN Outcome: Progressing   Problem: Activity: Goal: Risk for activity intolerance will decrease 07/28/2023 0945 by Maudie Sorrow, RN Outcome: Adequate for Discharge 07/28/2023 8657 by Maudie Sorrow, RN Outcome: Progressing   Problem: Nutrition: Goal: Adequate nutrition will be maintained 07/28/2023 0945 by Maudie Sorrow,  RN Outcome: Adequate for Discharge 07/28/2023 8469 by Maudie Sorrow, RN Outcome: Progressing   Problem: Coping: Goal: Level of anxiety will decrease 07/28/2023 0945 by Maudie Sorrow, RN Outcome: Adequate for Discharge 07/28/2023 6295 by Maudie Sorrow, RN Outcome: Progressing   Problem: Elimination: Goal: Will not experience complications related to bowel motility 07/28/2023 0945 by Maudie Sorrow, RN Outcome: Adequate for Discharge 07/28/2023 2841 by Maudie Sorrow, RN Outcome: Progressing Goal: Will not experience complications related to urinary retention 07/28/2023 0945 by Maudie Sorrow, RN Outcome: Adequate for Discharge 07/28/2023 3244 by Maudie Sorrow, RN Outcome: Progressing   Problem: Pain Managment: Goal: General experience of comfort will improve and/or be controlled 07/28/2023 0945 by Maudie Sorrow, RN Outcome: Adequate for Discharge 07/28/2023 0102 by Maudie Sorrow, RN Outcome: Progressing   Problem: Safety: Goal: Ability to remain free from injury will improve 07/28/2023 0945 by Maudie Sorrow, RN Outcome: Adequate for Discharge 07/28/2023 7253 by Maudie Sorrow, RN Outcome: Progressing   Problem: Skin Integrity: Goal: Risk for impaired skin integrity will decrease 07/28/2023 0945 by Maudie Sorrow, RN Outcome: Adequate for Discharge 07/28/2023 6644 by Maudie Sorrow, RN Outcome: Progressing   Problem: Education: Goal: Knowledge of the prescribed therapeutic regimen will improve 07/28/2023 0945 by Maudie Sorrow, RN Outcome: Adequate for Discharge 07/28/2023 0347 by Maudie Sorrow, RN Outcome: Progressing Goal: Understanding of sexual limitations or changes related to disease process or condition will improve 07/28/2023 0945 by Maudie Sorrow, RN Outcome: Adequate for Discharge 07/28/2023 4259 by Maudie Sorrow, RN Outcome: Progressing Goal:  Individualized Educational Video(s) 07/28/2023 0945 by Maudie Sorrow, RN Outcome: Adequate for Discharge 07/28/2023 [phone] by Maudie Sorrow, RN Outcome: Progressing   Problem: Self-Concept: Goal: Communication of feelings regarding changes in body function or appearance will improve 07/28/2023 0945 by Maudie Sorrow, RN Outcome: Adequate for Discharge 07/28/2023 7564 by Maudie Sorrow, RN Outcome: Progressing  Problem: Skin Integrity: Goal: Demonstration of wound healing without infection will improve 07/28/2023 0945 by Maudie Sorrow, RN Outcome: Adequate for Discharge 07/28/2023 1610 by Maudie Sorrow, RN Outcome: Progressing

## 2023-07-28 NOTE — Op Note (Signed)
 Katherine Ingram, LAPOR[place] MEDICAL RECORD NO: [id number] ACCOUNT NO: [id number] DATE OF BIRTH: Jan 10, 1981 FACILITY: [facility] LOCATION: [facility] PHYSICIAN: Tiphani Mells A. Lesta Rater, MD  Operative Report   DATE OF PROCEDURE: 07/27/2023  PREOPERATIVE DIAGNOSIS:  Symptomatic uterine fibroids.  PROCEDURE:  Da Vinci robotic total hysterectomy, bilateral salpingectomy, and cystoscopy.  POSTOPERATIVE DIAGNOSIS:  Symptomatic uterine fibroids.  ANESTHESIA:  General.  SURGEON:  Ivery Marking, MD.  ASSISTANTSantana Cue, RNFA.  DESCRIPTION OF PROCEDURE:  Under adequate general anesthesia, the patient was placed in the dorsal lithotomy position.  The patient was positioned for robotic surgery and an indwelling Foley catheter was sterilely placed.  A weighted speculum was placed  in the vagina.  A Sims retractor was placed anteriorly.  The cervix was short.  A 0 Vicryl figure-of-eight suture was placed on the anterior and posterior lip.  The uterus sounded to 11 cm.  The initial attempt at placement of the traditional Rumi cup  with the uterine manipulator was unsuccessful.  Therefore, a #3 Advincula manipulator was placed.  The retractors were removed.  Attention was then turned to the abdomen.  A supraumbilical incision was made after 0.25% Marcaine  was injected.  A Veress  needle was introduced.  Opening pressure of 4 mmHg was noted.  3 mL of CO2 was insufflated.  The Veress needle was then removed.  The Veress needle had been tested.  A disposable robotic trocar was introduced into the abdomen without incident.  A lighted  video camera was introduced and inspection of the abdomen showed evidence of entry without incident.  A large uterus filling the entire pelvis was noted containing multiple fluid-filled adhesions throughout the surface.  The right ovary had a cyst and  the right tube had bent on itself with scar tissue around it.  On the left side, the fallopian tube could be seen, but the left ovary  was plastered against the posterior wall of the uterus.  The Advincula had perforated on the rightanteriolateral fundal area without  any adverse effect.  Additional robotic ports were  then placed, one on the far left, two on the right, and an AirSeal 8 mm was placed, all under direct visualization.  The robot was then docked.  In arm #1, the vessel sealer was placed.  In arm #3, the long  bipolar grasper was placed and in arm #4, the monopolar scissors.  I then went to the surgical console.  At the surgical console, I looked for both ureters, which were noted to be peristalsing deep in the pelvis.  The procedure was started on the right  side for visibility purposes.  The fallopian tube on the right was with adhesions surrounding, it was lysed and the mesosalpinx was subsequently identified, serially clamped, cauterized, and cut.  That tube was removed.  The right retroperitoneal space  was then opened, dissected, and the medial portion of the posterior leaf was opened.  The right utero-ovarian ligament was serially clamped, cauterized, and then cut.  The round ligament was then identified, clamped, cauterized, and cut, carried to the  anterior leaf of the broad ligament around towards the bladder surface.  On the contralateral side, the procedure was started with removing the left ovary off of the posterior aspect of the uterus, identifying the left fallopian tube.  The mesosalpinx of  that tube was serially clamped, cauterized, and cut and the tube removed.  The left uretero retroperitoneal space was opened.  Dissection went below and the left round ligament was serially clamped,  cauterized, and cut.  This allowed for further  skeletonization and removing adhesions surrounding that left adnexal region.  The scope was switched to the 30-degree angular scope in order to facilitate visualization over this large uterus.  The vesicouterine peritoneum was then opened transversely  and over the cup and the  bladder was sharply dissected and displaced inferiorly.  Once that was done, the uterine vessel on that left was able to be better visualized and it was clamped, cauterized, and cut.  On the contralateral side, the camera had to  be flipped to the 30-degree down for further help with the uterine vessels on that side.  The uterine vessels were subsequently clamped, cauterized, and cut.  At that point, the anterior cervical vaginal junction was opened transversely and was clamped,  cauterized, and cut.  Subsequently, circumferentially, the cervix was removed from its vaginal attachment with evidence of some still posterior remnant of that cervix remaining.  The uterus was too large to bring down just by pulling and so, I left the  surgical console and went and took out the Advincula manipulator, put in vaginal retractors and grasped the cervix.  With bivalving the cervix and sequential coring of the uterus, the uterus was morcellated.  Once that was done, I then went back to the surgical console, identified any bleeders, removed the posterior portion of the cervix that was left. The posterior vaginal cuff defect was separately with interrupted figure-of-eight 0 Vicryl suture.  The left ovary appeared to still have good pink  tissue and was not bleeding and therefore remained.  The right adnexa was also well perfused.  The bladder was displaced further inferiorly and the instruments were switched out using the long tip forceps on the instead of the vessel sealer and the large Mega suture needle driver for the monopolar scissors.  The vaginal cuff was then closed with 0 V-Loc suture x 2. The pressure was decreased to 8 mmHg and any bleeding noted was cauterized carefully.  Good hemostasis was noted.  The Arista potato starch was placed over the vaginal cuff.  The instruments were removed.  The robot was undocked.  The ports were then removed and I went back to the vagina and did a 70-degree angular cystoscopy with  both ureters seen with urine coming out of both of the ureteral jets.  The cystoscope was removed.  The Foley was reinserted.  The vaginal cuff was inspected.  There was a small cuff edge the middle portion and a #0 Vicryl figure-of-eight suture was carefully placed and the remaining inspection of the  vagina and cuff was with good approximation.  SPECIMEN:  Morcellated uterus with cervix and fallopian tube weighing 805 g, sent to pathology.  ESTIMATED BLOOD LOSS:  100 mL.  INTRAOPERATIVE FLUIDS:  1450 mL including 250 mL albumin .  URINE OUTPUT:  450 mL.  COUNTS:  Sponge, instrument and needle counts x 2 were correct.  COMPLICATIONS:  None.  CONDITION:  The patient tolerated the procedure well.  DISPOSITION:  Was transferred to the recovery room in stable condition.    MUK D: 07/27/2023 4:22:09 pm T: 07/28/2023 1:03:00 am  JOB: [id number]/ [id number]

## 2023-07-28 NOTE — Discharge Instructions (Signed)
Call if temperature greater than equal to 100.4, nothing per vagina for 4-6 weeks or severe nausea vomiting, increased incisional pain , drainage or redness in the incision site, no straining with bowel movements, showers no bath °

## 2023-07-28 NOTE — Anesthesia Postprocedure Evaluation (Signed)
 Anesthesia Post Note  Patient: Katherine Ingram  Procedure(s) Performed: HYSTERECTOMY, TOTAL, ROBOT-ASSISTED BILATERAL SALPINGECTOMY, CYSTOSCOPY (Abdomen)     Patient location during evaluation: PACU Anesthesia Type: General Level of consciousness: awake and alert Pain management: pain level controlled Vital Signs Assessment: post-procedure vital signs reviewed and stable Respiratory status: spontaneous breathing, nonlabored ventilation, respiratory function stable and patient connected to nasal cannula oxygen Cardiovascular status: blood pressure returned to baseline and stable Postop Assessment: no apparent nausea or vomiting Anesthetic complications: no   No notable events documented.  Last Vitals:  Vitals:   07/28/23 0755 07/28/23 0858  BP: 112/79 111/75  Pulse: 84 74  Resp: 18 16  Temp: 37.1 C 36.7 C  SpO2: 100% 100%    Last Pain:  Vitals:   07/28/23 0755  TempSrc: Oral  PainSc: 0-No pain                 Theotis Flake P Kashtyn Jankowski

## 2023-07-28 NOTE — Progress Notes (Signed)
 Subjective: Patient reports {sub:3041132}.    Objective: I have reviewed patient's vital signs.  {reviewed:3041135}. Vitals:   07/27/23 2355 07/28/23 0601  BP: 108/68 112/63  Pulse: 91 89  Resp: 18 18  Temp: 98.8 F (37.1 C) 98.2 F (36.8 C)  SpO2: 98% 100%   I/O last 3 completed shifts: In: 2566.3 [P.O.:60; I.V.:2106.3; IV Piggyback:400] Out: 3350 [Urine:2500; Blood:850] No intake/output data recorded.  Lab Results  Component Value Date   WBC 15.8 (H) 07/28/2023   HGB 9.6 (L) 07/28/2023   HCT 31.1 (L) 07/28/2023   MCV 78.9 (L) 07/28/2023   PLT 368 07/28/2023   Lab Results  Component Value Date   CREATININE 0.81 07/26/2023    EXAM General: {Exam; general:16600} Resp: {Exam; lung:16931} Cardio: {Exam; heart:5510} GI: {Exam, WU:9811914} Extremities: {Exam; extremity:5109} Vaginal Bleeding: {exam; vaginal bleeding:3041122}  Assessment: s/p Procedure(s): HYSTERECTOMY, TOTAL, ROBOT-ASSISTED BILATERAL SALPINGECTOMY, CYSTOSCOPY: {assessment details:3041134}  Plan: {NWGN:5621308}  LOS: 0 days    Kandra Orn, MD 07/28/2023 7:33 AM    07/28/2023, 7:33 AM

## 2023-07-28 NOTE — Plan of Care (Signed)

## 2023-07-29 ENCOUNTER — Encounter (HOSPITAL_COMMUNITY): Payer: Self-pay | Admitting: Obstetrics and Gynecology

## 2023-07-30 ENCOUNTER — Other Ambulatory Visit: Payer: Self-pay

## 2023-07-31 LAB — SURGICAL PATHOLOGY
